# Patient Record
Sex: Male | Born: 1960 | Race: White | Hispanic: No | Marital: Married | Smoking: Current every day smoker
Health system: Southern US, Community
[De-identification: ages and names within clinical notes are randomized; demographics above are authoritative.]

## PROBLEM LIST (undated history)

## (undated) DIAGNOSIS — F1020 Alcohol dependence, uncomplicated: Secondary | ICD-10-CM

## (undated) DIAGNOSIS — Z789 Other specified health status: Secondary | ICD-10-CM

## (undated) HISTORY — PX: HAND SURGERY: SHX662

## (undated) HISTORY — PX: FINGER SURGERY: SHX640

---

## 2000-01-14 ENCOUNTER — Encounter: Admission: RE | Admit: 2000-01-14 | Discharge: 2000-01-14 | Payer: Self-pay | Admitting: Occupational Medicine

## 2000-01-14 ENCOUNTER — Encounter: Payer: Self-pay | Admitting: Occupational Medicine

## 2014-08-28 ENCOUNTER — Emergency Department (HOSPITAL_COMMUNITY): Payer: No Typology Code available for payment source

## 2014-08-28 ENCOUNTER — Observation Stay (HOSPITAL_COMMUNITY)
Admission: EM | Admit: 2014-08-28 | Discharge: 2014-08-30 | Disposition: A | Discharge status: Home / Self Care | Payer: No Typology Code available for payment source | Attending: General Surgery | Admitting: General Surgery

## 2014-08-28 ENCOUNTER — Encounter (HOSPITAL_COMMUNITY): Payer: Self-pay

## 2014-08-28 DIAGNOSIS — S2243XA Multiple fractures of ribs, bilateral, initial encounter for closed fracture: Secondary | ICD-10-CM | POA: Diagnosis not present

## 2014-08-28 DIAGNOSIS — W2212XA Striking against or struck by front passenger side automobile airbag, initial encounter: Secondary | ICD-10-CM | POA: Insufficient documentation

## 2014-08-28 DIAGNOSIS — S2222XA Fracture of body of sternum, initial encounter for closed fracture: Secondary | ICD-10-CM | POA: Diagnosis not present

## 2014-08-28 DIAGNOSIS — Z23 Encounter for immunization: Secondary | ICD-10-CM | POA: Diagnosis not present

## 2014-08-28 DIAGNOSIS — S161XXA Strain of muscle, fascia and tendon at neck level, initial encounter: Secondary | ICD-10-CM | POA: Diagnosis not present

## 2014-08-28 DIAGNOSIS — M542 Cervicalgia: Secondary | ICD-10-CM | POA: Diagnosis present

## 2014-08-28 DIAGNOSIS — S022XXA Fracture of nasal bones, initial encounter for closed fracture: Secondary | ICD-10-CM | POA: Insufficient documentation

## 2014-08-28 DIAGNOSIS — S2220XA Unspecified fracture of sternum, initial encounter for closed fracture: Secondary | ICD-10-CM | POA: Diagnosis present

## 2014-08-28 DIAGNOSIS — F17210 Nicotine dependence, cigarettes, uncomplicated: Secondary | ICD-10-CM | POA: Insufficient documentation

## 2014-08-28 HISTORY — DX: Other specified health status: Z78.9

## 2014-08-28 LAB — CBC WITH DIFFERENTIAL/PLATELET
BASOS ABS: 0.1 10*3/uL (ref 0.0–0.1)
BASOS PCT: 1 % (ref 0–1)
EOS PCT: 3 % (ref 0–5)
Eosinophils Absolute: 0.2 10*3/uL (ref 0.0–0.7)
HCT: 39.2 % (ref 39.0–52.0)
Hemoglobin: 13.5 g/dL (ref 13.0–17.0)
Lymphocytes Relative: 26 % (ref 12–46)
Lymphs Abs: 2.3 10*3/uL (ref 0.7–4.0)
MCH: 33.4 pg (ref 26.0–34.0)
MCHC: 34.4 g/dL (ref 30.0–36.0)
MCV: 97 fL (ref 78.0–100.0)
MONO ABS: 0.9 10*3/uL (ref 0.1–1.0)
Monocytes Relative: 11 % (ref 3–12)
Neutro Abs: 5.2 10*3/uL (ref 1.7–7.7)
Neutrophils Relative %: 59 % (ref 43–77)
PLATELETS: 316 10*3/uL (ref 150–400)
RBC: 4.04 MIL/uL — AB (ref 4.22–5.81)
RDW: 13.1 % (ref 11.5–15.5)
WBC: 8.7 10*3/uL (ref 4.0–10.5)

## 2014-08-28 LAB — COMPREHENSIVE METABOLIC PANEL
ALBUMIN: 4.2 g/dL (ref 3.5–5.0)
ALT: 67 U/L — AB (ref 17–63)
AST: 69 U/L — AB (ref 15–41)
Alkaline Phosphatase: 54 U/L (ref 38–126)
Anion gap: 6 (ref 5–15)
BUN: 21 mg/dL — AB (ref 6–20)
CHLORIDE: 112 mmol/L — AB (ref 101–111)
CO2: 23 mmol/L (ref 22–32)
Calcium: 9.4 mg/dL (ref 8.9–10.3)
Creatinine, Ser: 1.38 mg/dL — ABNORMAL HIGH (ref 0.61–1.24)
GFR calc Af Amer: >60 mL/min (ref >60)
GFR calc non Af Amer: 57 mL/min — ABNORMAL LOW (ref >60)
GLUCOSE: 90 mg/dL (ref 65–99)
POTASSIUM: 3.7 mmol/L (ref 3.5–5.1)
SODIUM: 141 mmol/L (ref 135–145)
Total Bilirubin: 0.5 mg/dL (ref 0.3–1.2)
Total Protein: 7.2 g/dL (ref 6.5–8.1)

## 2014-08-28 MED ORDER — HYDROMORPHONE HCL 1 MG/ML IJ SOLN
1.0000 mg | Freq: Once | INTRAMUSCULAR | Status: AC
  Administered 2014-08-28: 1 mg via INTRAVENOUS
  Filled 2014-08-28: qty 1

## 2014-08-28 MED ORDER — SODIUM CHLORIDE 0.9 % IV BOLUS (SEPSIS)
1000.0000 mL | Freq: Once | INTRAVENOUS | Status: AC
  Administered 2014-08-28: 1000 mL via INTRAVENOUS

## 2014-08-28 MED ORDER — SODIUM CHLORIDE 0.9 % IV BOLUS (SEPSIS)
500.0000 mL | Freq: Once | INTRAVENOUS | Status: AC
  Administered 2014-08-28: 500 mL via INTRAVENOUS

## 2014-08-28 MED ORDER — IOHEXOL 300 MG/ML  SOLN
100.0000 mL | Freq: Once | INTRAMUSCULAR | Status: AC | PRN
  Administered 2014-08-28: 100 mL via INTRAVENOUS

## 2014-08-28 MED ORDER — ONDANSETRON HCL 4 MG/2ML IJ SOLN
4.0000 mg | Freq: Once | INTRAMUSCULAR | Status: AC
  Administered 2014-08-28: 4 mg via INTRAVENOUS
  Filled 2014-08-28: qty 2

## 2014-08-28 MED ORDER — HYDROMORPHONE HCL 1 MG/ML IJ SOLN
0.5000 mg | Freq: Once | INTRAMUSCULAR | Status: AC
  Administered 2014-08-28: 0.5 mg via INTRAVENOUS
  Filled 2014-08-28: qty 1

## 2014-08-28 MED ORDER — HYDROMORPHONE HCL 1 MG/ML IJ SOLN
INTRAMUSCULAR | Status: AC
  Filled 2014-08-28: qty 1

## 2014-08-28 MED ORDER — HYDROMORPHONE HCL 1 MG/ML IJ SOLN
1.0000 mg | Freq: Once | INTRAMUSCULAR | Status: AC
  Administered 2014-08-28: 1 mg via INTRAVENOUS
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
## 2014-08-28 NOTE — ED Notes (Addendum)
C spine was previously cleared but pt complaining of neck pain C-collar still in place. Pt also reports sternal and rib pain.
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
## 2014-08-29 ENCOUNTER — Encounter (HOSPITAL_COMMUNITY): Payer: Self-pay | Admitting: General Practice

## 2014-08-29 ENCOUNTER — Observation Stay (HOSPITAL_COMMUNITY): Payer: No Typology Code available for payment source

## 2014-08-29 DIAGNOSIS — S2243XA Multiple fractures of ribs, bilateral, initial encounter for closed fracture: Secondary | ICD-10-CM | POA: Diagnosis present

## 2014-08-29 DIAGNOSIS — S2220XA Unspecified fracture of sternum, initial encounter for closed fracture: Secondary | ICD-10-CM | POA: Diagnosis present

## 2014-08-29 DIAGNOSIS — S161XXA Strain of muscle, fascia and tendon at neck level, initial encounter: Secondary | ICD-10-CM | POA: Diagnosis present

## 2014-08-29 DIAGNOSIS — S022XXA Fracture of nasal bones, initial encounter for closed fracture: Secondary | ICD-10-CM | POA: Diagnosis present

## 2014-08-29 MED ORDER — DOCUSATE SODIUM 100 MG PO CAPS
100.0000 mg | ORAL_CAPSULE | Freq: Two times a day (BID) | ORAL | Status: DC
  Administered 2014-08-29 – 2014-08-30 (×3): 100 mg via ORAL
  Filled 2014-08-29 (×3): qty 1

## 2014-08-29 MED ORDER — SODIUM CHLORIDE 0.9 % IJ SOLN: 3.0000 mL | INTRAMUSCULAR | Status: DC | PRN

## 2014-08-29 MED ORDER — HYDROMORPHONE HCL 1 MG/ML IJ SOLN
0.5000 mg | INTRAMUSCULAR | Status: DC | PRN
  Administered 2014-08-29 – 2014-08-30 (×3): 0.5 mg via INTRAVENOUS
  Filled 2014-08-29 (×3): qty 1

## 2014-08-29 MED ORDER — HYDROMORPHONE HCL 1 MG/ML IJ SOLN
1.0000 mg | INTRAMUSCULAR | Status: DC | PRN
  Administered 2014-08-29 (×2): 1 mg via INTRAVENOUS
  Filled 2014-08-29 (×2): qty 1

## 2014-08-29 MED ORDER — HYDROMORPHONE HCL 1 MG/ML IJ SOLN
1.0000 mg | Freq: Once | INTRAMUSCULAR | Status: AC
Start: 2014-08-29 — End: 2014-08-29
  Administered 2014-08-29: 1 mg via INTRAVENOUS
  Filled 2014-08-29: qty 1

## 2014-08-29 MED ORDER — POLYETHYLENE GLYCOL 3350 17 G PO PACK
17.0000 g | PACK | Freq: Every day | ORAL | Status: DC
  Administered 2014-08-29 – 2014-08-30 (×2): 17 g via ORAL
  Filled 2014-08-29 (×2): qty 1

## 2014-08-29 MED ORDER — ONDANSETRON HCL 4 MG PO TABS: 4.0000 mg | ORAL_TABLET | Freq: Four times a day (QID) | ORAL | Status: DC | PRN

## 2014-08-29 MED ORDER — SODIUM CHLORIDE 0.9 % IJ SOLN: 3.0000 mL | Freq: Two times a day (BID) | INTRAMUSCULAR | Status: DC

## 2014-08-29 MED ORDER — PNEUMOCOCCAL VAC POLYVALENT 25 MCG/0.5ML IJ INJ
0.5000 mL | INJECTION | INTRAMUSCULAR | Status: AC
  Administered 2014-08-30: 0.5 mL via INTRAMUSCULAR
  Filled 2014-08-29: qty 0.5

## 2014-08-29 MED ORDER — SODIUM CHLORIDE 0.9 % IV SOLN: 250.0000 mL | INTRAVENOUS | Status: DC | PRN

## 2014-08-29 MED ORDER — ONDANSETRON HCL 4 MG/2ML IJ SOLN
4.0000 mg | Freq: Four times a day (QID) | INTRAMUSCULAR | Status: DC | PRN
Start: 2014-08-29 — End: 2014-08-30

## 2014-08-29 MED ORDER — ENOXAPARIN SODIUM 40 MG/0.4ML ~~LOC~~ SOLN
40.0000 mg | SUBCUTANEOUS | Status: DC
  Administered 2014-08-29 – 2014-08-30 (×2): 40 mg via SUBCUTANEOUS
  Filled 2014-08-29 (×2): qty 0.4

## 2014-08-29 MED ORDER — OXYCODONE HCL 5 MG PO TABS
5.0000 mg | ORAL_TABLET | ORAL | Status: DC | PRN
Start: 2014-08-29 — End: 2014-08-30
  Administered 2014-08-29: 15 mg via ORAL
  Administered 2014-08-29 (×2): 10 mg via ORAL
  Administered 2014-08-30 (×3): 15 mg via ORAL
  Filled 2014-08-29 (×3): qty 3
  Filled 2014-08-29 (×2): qty 2
  Filled 2014-08-29: qty 3

## 2014-08-29 MED ORDER — OXYCODONE HCL 5 MG PO TABS
5.0000 mg | ORAL_TABLET | ORAL | Status: DC | PRN
  Administered 2014-08-29 (×2): 5 mg via ORAL
  Filled 2014-08-29 (×2): qty 1

## 2014-08-29 NOTE — ED Notes (Signed)
Transported in stable condition , respirations unlabored , IV site intact , Dilaudid 1 mg given for bilateral ribcage and sternal pain prior to transport.
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
## 2014-08-30 MED ORDER — OXYCODONE HCL 10 MG PO TABS: 10.0000 mg | ORAL_TABLET | ORAL | Status: DC | PRN

## 2014-08-30 MED ORDER — OXYCODONE-ACETAMINOPHEN 10-325 MG PO TABS: 1.0000 | ORAL_TABLET | ORAL | Status: DC | PRN

## 2014-08-30 MED ORDER — NAPROXEN 500 MG PO TABS: 500.0000 mg | ORAL_TABLET | Freq: Two times a day (BID) | ORAL | Status: DC

## 2014-08-30 MED ORDER — NAPROXEN 250 MG PO TABS: 500.0000 mg | ORAL_TABLET | Freq: Two times a day (BID) | ORAL | Status: DC

## 2014-08-30 MED ORDER — OXYCODONE HCL 5 MG PO TABS
10.0000 mg | ORAL_TABLET | ORAL | Status: DC | PRN
  Administered 2014-08-30: 20 mg via ORAL
  Filled 2014-08-30: qty 4

## 2014-08-30 NOTE — Discharge Instructions (Signed)
No driving while taking oxycodone. °
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
## 2014-08-30 NOTE — Progress Notes (Signed)
Andre Rangel discharged home per MD order. Discharge instructions reviewed and discussed with patient. All questions and concerns answered. Copy of instructions and scripts given to patient. IV removed.  Patient escorted to car by staff in a wheelchair. No distress noted upon discharge.   [name] 08/30/2014 3:24 PM
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
## 2014-09-04 ENCOUNTER — Telehealth: Payer: Self-pay | Admitting: *Deleted

## 2014-09-04 ENCOUNTER — Telehealth (HOSPITAL_COMMUNITY): Payer: Self-pay

## 2014-09-04 NOTE — Telephone Encounter (Signed)
Pt called for phone number to reach Earney [place], [place]. NCM gave him [phone]

## 2014-09-05 NOTE — Telephone Encounter (Signed)
NA @ home number. Left message at cell but said owner was "Matt".

## 2014-09-06 MED ORDER — TRAMADOL HCL 50 MG PO TABS: 100.0000 mg | ORAL_TABLET | Freq: Four times a day (QID) | ORAL | Status: AC

## 2014-09-06 NOTE — Telephone Encounter (Signed)
Pt called to say pain meds were not strong enough. Suggested he try tramadol in addition and I wrote a rx for him to pick up.

## 2014-09-13 ENCOUNTER — Other Ambulatory Visit (HOSPITAL_COMMUNITY): Payer: Self-pay

## 2014-09-13 MED ORDER — OXYCODONE-ACETAMINOPHEN 10-325 MG PO TABS
1.0000 | ORAL_TABLET | ORAL | Status: DC | PRN
Start: 2014-09-13 — End: 2017-05-28

## 2014-09-13 NOTE — Telephone Encounter (Signed)
DTR called for pain med refill. Gave rx for Perc 10/325, #20

## 2015-04-20 ENCOUNTER — Encounter (HOSPITAL_COMMUNITY): Payer: Self-pay | Admitting: Emergency Medicine

## 2015-04-20 ENCOUNTER — Emergency Department (HOSPITAL_COMMUNITY)
Admission: EM | Admit: 2015-04-20 | Discharge: 2015-04-20 | Disposition: A | Discharge status: Home / Self Care | Payer: Self-pay | Attending: Emergency Medicine | Admitting: Emergency Medicine

## 2015-04-20 ENCOUNTER — Emergency Department (HOSPITAL_COMMUNITY): Payer: Self-pay

## 2015-04-20 DIAGNOSIS — S0990XA Unspecified injury of head, initial encounter: Secondary | ICD-10-CM

## 2015-04-20 DIAGNOSIS — F17210 Nicotine dependence, cigarettes, uncomplicated: Secondary | ICD-10-CM | POA: Insufficient documentation

## 2015-04-20 DIAGNOSIS — S0181XA Laceration without foreign body of other part of head, initial encounter: Secondary | ICD-10-CM

## 2015-04-20 DIAGNOSIS — Y9389 Activity, other specified: Secondary | ICD-10-CM | POA: Insufficient documentation

## 2015-04-20 DIAGNOSIS — Y929 Unspecified place or not applicable: Secondary | ICD-10-CM | POA: Insufficient documentation

## 2015-04-20 DIAGNOSIS — S0101XA Laceration without foreign body of scalp, initial encounter: Secondary | ICD-10-CM | POA: Insufficient documentation

## 2015-04-20 DIAGNOSIS — Y999 Unspecified external cause status: Secondary | ICD-10-CM | POA: Insufficient documentation

## 2015-04-20 MED ORDER — ACETAMINOPHEN 500 MG PO TABS
1000.0000 mg | ORAL_TABLET | Freq: Once | ORAL | Status: AC
  Administered 2015-04-20: 1000 mg via ORAL
  Filled 2015-04-20: qty 2
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
## 2015-04-20 NOTE — ED Notes (Signed)
Cleaned two laceration to head with wound cleanser and applied steri-strips, patient tolerated well.

## 2015-04-20 NOTE — Discharge Instructions (Signed)
Brain scan showed no acute injury. Simple Steri-Strips to laceration.  Keep area clean and dry.
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
## 2017-05-27 ENCOUNTER — Encounter (HOSPITAL_COMMUNITY): Payer: Self-pay | Admitting: Emergency Medicine

## 2017-05-27 ENCOUNTER — Inpatient Hospital Stay (HOSPITAL_COMMUNITY)
Admission: EM | Admit: 2017-05-27 | Discharge: 2017-06-04 | DRG: 897 | Disposition: A | Discharge status: Home / Self Care | Payer: Self-pay | Attending: Internal Medicine | Admitting: Internal Medicine

## 2017-05-27 ENCOUNTER — Other Ambulatory Visit: Payer: Self-pay

## 2017-05-27 DIAGNOSIS — F17210 Nicotine dependence, cigarettes, uncomplicated: Secondary | ICD-10-CM | POA: Diagnosis present

## 2017-05-27 DIAGNOSIS — R7303 Prediabetes: Secondary | ICD-10-CM | POA: Diagnosis present

## 2017-05-27 DIAGNOSIS — M479 Spondylosis, unspecified: Secondary | ICD-10-CM | POA: Diagnosis present

## 2017-05-27 DIAGNOSIS — M160 Bilateral primary osteoarthritis of hip: Secondary | ICD-10-CM | POA: Diagnosis present

## 2017-05-27 DIAGNOSIS — E861 Hypovolemia: Secondary | ICD-10-CM | POA: Diagnosis present

## 2017-05-27 DIAGNOSIS — Z6823 Body mass index (BMI) 23.0-23.9, adult: Secondary | ICD-10-CM

## 2017-05-27 DIAGNOSIS — F10939 Alcohol use, unspecified with withdrawal, unspecified: Secondary | ICD-10-CM

## 2017-05-27 DIAGNOSIS — E871 Hypo-osmolality and hyponatremia: Secondary | ICD-10-CM | POA: Diagnosis present

## 2017-05-27 DIAGNOSIS — F10239 Alcohol dependence with withdrawal, unspecified: Principal | ICD-10-CM | POA: Diagnosis present

## 2017-05-27 DIAGNOSIS — R739 Hyperglycemia, unspecified: Secondary | ICD-10-CM

## 2017-05-27 DIAGNOSIS — Z885 Allergy status to narcotic agent status: Secondary | ICD-10-CM

## 2017-05-27 DIAGNOSIS — E440 Moderate protein-calorie malnutrition: Secondary | ICD-10-CM | POA: Diagnosis present

## 2017-05-27 DIAGNOSIS — Z888 Allergy status to other drugs, medicaments and biological substances status: Secondary | ICD-10-CM

## 2017-05-27 DIAGNOSIS — K7010 Alcoholic hepatitis without ascites: Secondary | ICD-10-CM | POA: Diagnosis present

## 2017-05-27 HISTORY — DX: Alcohol dependence, uncomplicated: F10.20

## 2017-05-27 LAB — COMPREHENSIVE METABOLIC PANEL
ALBUMIN: 4.4 g/dL (ref 3.5–5.0)
ALK PHOS: 85 U/L (ref 38–126)
ALT: 189 U/L — AB (ref 17–63)
AST: 266 U/L — ABNORMAL HIGH (ref 15–41)
BUN: 21 mg/dL — AB (ref 6–20)
CHLORIDE: 81 mmol/L — AB (ref 101–111)
CO2: 19 mmol/L — AB (ref 22–32)
CREATININE: 0.84 mg/dL (ref 0.61–1.24)
Calcium: 9.4 mg/dL (ref 8.9–10.3)
GFR calc Af Amer: >60 mL/min (ref >60)
GFR calc non Af Amer: >60 mL/min (ref >60)
Glucose, Bld: 130 mg/dL — ABNORMAL HIGH (ref 65–99)
Potassium: 4.3 mmol/L (ref 3.5–5.1)
SODIUM: 125 mmol/L — AB (ref 135–145)
Total Bilirubin: 2.1 mg/dL — ABNORMAL HIGH (ref 0.3–1.2)
Total Protein: 8.6 g/dL — ABNORMAL HIGH (ref 6.5–8.1)

## 2017-05-27 LAB — MAGNESIUM: Magnesium: 1.7 mg/dL (ref 1.7–2.4)

## 2017-05-27 LAB — RAPID URINE DRUG SCREEN, HOSP PERFORMED
Amphetamines: NOT DETECTED
BARBITURATES: NOT DETECTED
Benzodiazepines: NOT DETECTED
Cocaine: NOT DETECTED
Opiates: NOT DETECTED
Tetrahydrocannabinol: NOT DETECTED

## 2017-05-27 LAB — CBC
HCT: 39.9 % (ref 39.0–52.0)
Hemoglobin: 14.5 g/dL (ref 13.0–17.0)
MCH: 31.9 pg (ref 26.0–34.0)
MCHC: 36.3 g/dL — ABNORMAL HIGH (ref 30.0–36.0)
MCV: 87.7 fL (ref 78.0–100.0)
Platelets: 128 K/uL — ABNORMAL LOW (ref 150–400)
RBC: 4.55 MIL/uL (ref 4.22–5.81)
RDW: 12.1 % (ref 11.5–15.5)
WBC: 8.1 K/uL (ref 4.0–10.5)

## 2017-05-27 LAB — ETHANOL: Alcohol, Ethyl (B): <10 mg/dL

## 2017-05-27 LAB — PROTIME-INR
INR: 1.01
Prothrombin Time: 13.2 seconds (ref 11.4–15.2)

## 2017-05-27 MED ORDER — LORAZEPAM 1 MG PO TABS: 0.0000 mg | ORAL_TABLET | Freq: Two times a day (BID) | ORAL | Status: DC

## 2017-05-27 MED ORDER — LORAZEPAM 1 MG PO TABS
0.0000 mg | ORAL_TABLET | Freq: Four times a day (QID) | ORAL | Status: DC
  Administered 2017-05-28 (×3): 1 mg via ORAL
  Administered 2017-05-28 – 2017-05-29 (×4): 2 mg via ORAL
  Filled 2017-05-27: qty 2
  Filled 2017-05-27: qty 1
  Filled 2017-05-27 (×5): qty 2

## 2017-05-27 MED ORDER — IBUPROFEN 400 MG PO TABS
400.0000 mg | ORAL_TABLET | Freq: Once | ORAL | Status: AC
  Administered 2017-05-27: 400 mg via ORAL
  Filled 2017-05-27: qty 1

## 2017-05-27 MED ORDER — LORAZEPAM 1 MG PO TABS
1.0000 mg | ORAL_TABLET | Freq: Four times a day (QID) | ORAL | Status: AC | PRN
  Administered 2017-05-29 – 2017-05-30 (×2): 1 mg via ORAL
  Filled 2017-05-27 (×2): qty 1

## 2017-05-27 MED ORDER — ADULT MULTIVITAMIN W/MINERALS CH: 1.0000 | ORAL_TABLET | Freq: Every day | ORAL | Status: DC

## 2017-05-27 MED ORDER — ENOXAPARIN SODIUM 40 MG/0.4ML ~~LOC~~ SOLN
40.0000 mg | SUBCUTANEOUS | Status: DC
  Administered 2017-05-28 – 2017-06-03 (×8): 40 mg via SUBCUTANEOUS
  Filled 2017-05-27 (×8): qty 0.4

## 2017-05-27 MED ORDER — SODIUM CHLORIDE 0.9 % IV SOLN
INTRAVENOUS | Status: AC
  Administered 2017-05-28 (×2): via INTRAVENOUS

## 2017-05-27 MED ORDER — OXYCODONE HCL 5 MG PO TABS
5.0000 mg | ORAL_TABLET | Freq: Four times a day (QID) | ORAL | Status: DC | PRN
  Administered 2017-05-28: 5 mg via ORAL
  Administered 2017-05-28 – 2017-05-29 (×5): 10 mg via ORAL
  Filled 2017-05-27: qty 1
  Filled 2017-05-27 (×5): qty 2

## 2017-05-27 MED ORDER — LACTATED RINGERS IV BOLUS
1000.0000 mL | Freq: Once | INTRAVENOUS | Status: AC
  Administered 2017-05-27: 1000 mL via INTRAVENOUS

## 2017-05-27 MED ORDER — LORAZEPAM 2 MG/ML IJ SOLN
1.0000 mg | Freq: Once | INTRAMUSCULAR | Status: AC
  Administered 2017-05-27: 1 mg via INTRAVENOUS
  Filled 2017-05-27: qty 1

## 2017-05-27 MED ORDER — FAMOTIDINE IN NACL 20-0.9 MG/50ML-% IV SOLN
20.0000 mg | Freq: Two times a day (BID) | INTRAVENOUS | Status: DC
  Administered 2017-05-28: 20 mg via INTRAVENOUS
  Filled 2017-05-27 (×2): qty 50

## 2017-05-27 MED ORDER — ADULT MULTIVITAMIN W/MINERALS CH
1.0000 | ORAL_TABLET | Freq: Every day | ORAL | Status: DC
  Administered 2017-05-27 – 2017-06-04 (×9): 1 via ORAL
  Filled 2017-05-27 (×9): qty 1

## 2017-05-27 MED ORDER — IBUPROFEN 400 MG PO TABS
400.0000 mg | ORAL_TABLET | Freq: Four times a day (QID) | ORAL | Status: DC | PRN
  Administered 2017-05-28 – 2017-06-03 (×7): 400 mg via ORAL
  Filled 2017-05-27 (×7): qty 1

## 2017-05-27 MED ORDER — FOLIC ACID 1 MG PO TABS
1.0000 mg | ORAL_TABLET | Freq: Every day | ORAL | Status: DC
  Administered 2017-05-27 – 2017-06-04 (×9): 1 mg via ORAL
  Filled 2017-05-27 (×9): qty 1

## 2017-05-27 MED ORDER — FOLIC ACID 1 MG PO TABS: 1.0000 mg | ORAL_TABLET | Freq: Every day | ORAL | Status: DC

## 2017-05-27 MED ORDER — LORAZEPAM 2 MG/ML IJ SOLN
1.0000 mg | Freq: Four times a day (QID) | INTRAMUSCULAR | Status: DC | PRN
  Administered 2017-05-27: 1 mg via INTRAVENOUS
  Filled 2017-05-27: qty 1

## 2017-05-27 MED ORDER — ONDANSETRON HCL 4 MG PO TABS
4.0000 mg | ORAL_TABLET | Freq: Four times a day (QID) | ORAL | Status: DC | PRN
  Administered 2017-06-02 – 2017-06-03 (×3): 4 mg via ORAL
  Filled 2017-05-27 (×3): qty 1

## 2017-05-27 MED ORDER — MAGNESIUM SULFATE 2 GM/50ML IV SOLN
2.0000 g | Freq: Once | INTRAVENOUS | Status: AC
  Administered 2017-05-28: 2 g via INTRAVENOUS
  Filled 2017-05-27: qty 50

## 2017-05-27 MED ORDER — VITAMIN B-1 100 MG PO TABS
100.0000 mg | ORAL_TABLET | Freq: Every day | ORAL | Status: DC
  Administered 2017-05-27 – 2017-06-04 (×9): 100 mg via ORAL
  Filled 2017-05-27 (×9): qty 1

## 2017-05-27 MED ORDER — THIAMINE HCL 100 MG/ML IJ SOLN
100.0000 mg | Freq: Every day | INTRAMUSCULAR | Status: DC
  Filled 2017-05-27: qty 2

## 2017-05-27 MED ORDER — ONDANSETRON HCL 4 MG/2ML IJ SOLN
4.0000 mg | Freq: Four times a day (QID) | INTRAMUSCULAR | Status: DC | PRN
  Administered 2017-05-29 – 2017-05-31 (×2): 4 mg via INTRAVENOUS
  Filled 2017-05-27 (×2): qty 2

## 2017-05-27 MED ORDER — THIAMINE HCL 100 MG/ML IJ SOLN
Freq: Once | INTRAVENOUS | Status: AC
  Administered 2017-05-27: 17:00:00 via INTRAVENOUS
  Filled 2017-05-27: qty 1000

## 2017-05-27 MED ORDER — LORAZEPAM 1 MG PO TABS: 1.0000 mg | ORAL_TABLET | Freq: Four times a day (QID) | ORAL | Status: DC | PRN

## 2017-05-27 MED ORDER — VITAMIN B-1 100 MG PO TABS: 100.0000 mg | ORAL_TABLET | Freq: Every day | ORAL | Status: DC

## 2017-05-27 MED ORDER — ONDANSETRON HCL 4 MG/2ML IJ SOLN
4.0000 mg | Freq: Once | INTRAMUSCULAR | Status: AC
  Administered 2017-05-27: 4 mg via INTRAVENOUS
  Filled 2017-05-27: qty 2

## 2017-05-27 MED ORDER — LORAZEPAM 2 MG/ML IJ SOLN
1.0000 mg | Freq: Four times a day (QID) | INTRAMUSCULAR | Status: AC | PRN
  Administered 2017-05-30 (×3): 1 mg via INTRAVENOUS
  Filled 2017-05-27 (×3): qty 1

## 2017-05-27 MED ORDER — THIAMINE HCL 100 MG/ML IJ SOLN: 100.0000 mg | Freq: Every day | INTRAMUSCULAR | Status: DC

## 2017-05-27 NOTE — ED Triage Notes (Signed)
Pt states he thinks he is going through ETOH withdrawal, last drank 1L wine this morning. States he normally drinks 3-4L of wine a day. N/ and leg weakness per pt. CBG 113. Minimal hand tremor noted.

## 2017-05-27 NOTE — ED Notes (Signed)
Patient still unable to void at this time.  
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
## 2017-05-28 DIAGNOSIS — Z720 Tobacco use: Secondary | ICD-10-CM

## 2017-05-28 DIAGNOSIS — R739 Hyperglycemia, unspecified: Secondary | ICD-10-CM

## 2017-05-28 LAB — CBC WITH DIFFERENTIAL/PLATELET
Basophils Absolute: 0 10*3/uL (ref 0.0–0.1)
Basophils Relative: 0 %
Eosinophils Absolute: 0 10*3/uL (ref 0.0–0.7)
Eosinophils Relative: 0 %
HCT: 32.3 % — ABNORMAL LOW (ref 39.0–52.0)
HEMOGLOBIN: 11.5 g/dL — AB (ref 13.0–17.0)
LYMPHS ABS: 1.6 10*3/uL (ref 0.7–4.0)
Lymphocytes Relative: 17 %
MCH: 31.6 pg (ref 26.0–34.0)
MCHC: 35.6 g/dL (ref 30.0–36.0)
MCV: 88.7 fL (ref 78.0–100.0)
MONOS PCT: 8 %
Monocytes Absolute: 0.7 10*3/uL (ref 0.1–1.0)
NEUTROS PCT: 75 %
Neutro Abs: 7 10*3/uL (ref 1.7–7.7)
Platelets: 97 10*3/uL — ABNORMAL LOW (ref 150–400)
RBC: 3.64 MIL/uL — ABNORMAL LOW (ref 4.22–5.81)
RDW: 12.2 % (ref 11.5–15.5)
WBC: 9.3 10*3/uL (ref 4.0–10.5)

## 2017-05-28 LAB — COMPREHENSIVE METABOLIC PANEL
ALBUMIN: 3.4 g/dL — AB (ref 3.5–5.0)
ALT: 132 U/L — ABNORMAL HIGH (ref 17–63)
AST: 199 U/L — AB (ref 15–41)
Alkaline Phosphatase: 67 U/L (ref 38–126)
Anion gap: 10 (ref 5–15)
BUN: 22 mg/dL — AB (ref 6–20)
CHLORIDE: 92 mmol/L — AB (ref 101–111)
CO2: 25 mmol/L (ref 22–32)
Calcium: 8.3 mg/dL — ABNORMAL LOW (ref 8.9–10.3)
Creatinine, Ser: 0.88 mg/dL (ref 0.61–1.24)
GFR calc non Af Amer: >60 mL/min (ref >60)
Glucose, Bld: 120 mg/dL — ABNORMAL HIGH (ref 65–99)
Potassium: 3.2 mmol/L — ABNORMAL LOW (ref 3.5–5.1)
SODIUM: 127 mmol/L — AB (ref 135–145)
Total Bilirubin: 1.4 mg/dL — ABNORMAL HIGH (ref 0.3–1.2)
Total Protein: 6.4 g/dL — ABNORMAL LOW (ref 6.5–8.1)

## 2017-05-28 LAB — HEMOGLOBIN A1C
Hgb A1c MFr Bld: 5.8 % — ABNORMAL HIGH (ref 4.8–5.6)
MEAN PLASMA GLUCOSE: 119.76 mg/dL

## 2017-05-28 LAB — PHOSPHORUS: Phosphorus: 2.7 mg/dL (ref 2.5–4.6)

## 2017-05-28 LAB — GLUCOSE, CAPILLARY: Glucose-Capillary: 93 mg/dL (ref 65–99)

## 2017-05-28 LAB — MAGNESIUM: MAGNESIUM: 2.3 mg/dL (ref 1.7–2.4)

## 2017-05-28 MED ORDER — POTASSIUM CHLORIDE CRYS ER 20 MEQ PO TBCR
40.0000 meq | EXTENDED_RELEASE_TABLET | ORAL | Status: AC
  Administered 2017-05-28 (×3): 40 meq via ORAL
  Filled 2017-05-28 (×4): qty 2

## 2017-05-28 MED ORDER — NICOTINE 21 MG/24HR TD PT24
21.0000 mg | MEDICATED_PATCH | Freq: Every day | TRANSDERMAL | Status: DC
  Administered 2017-05-28 – 2017-06-04 (×8): 21 mg via TRANSDERMAL
  Filled 2017-05-28 (×8): qty 1

## 2017-05-28 MED ORDER — ENSURE ENLIVE PO LIQD
237.0000 mL | Freq: Two times a day (BID) | ORAL | Status: DC
  Administered 2017-05-28 – 2017-06-03 (×13): 237 mL via ORAL

## 2017-05-28 MED ORDER — FAMOTIDINE 20 MG PO TABS
20.0000 mg | ORAL_TABLET | Freq: Two times a day (BID) | ORAL | Status: DC
  Administered 2017-05-28 – 2017-06-04 (×15): 20 mg via ORAL
  Filled 2017-05-28 (×15): qty 1
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
## 2017-05-28 NOTE — Clinical Social Work Note (Signed)
Clinical Social Work Assessment  Patient Details  Name: Andre Rangel MRN: [id number] Date of Birth: August 18, 1960  Date of referral:  05/28/17               Reason for consult:  Substance Use/ETOH Abuse                Permission sought to share information with:    Permission granted to share information::     Name::        Agency::     Relationship::     Contact Information:     Housing/Transportation Living arrangements for the past 2 months:  Single Family Home Source of Information:  Patient Patient Interpreter Needed:  None Criminal Activity/Legal Involvement Pertinent to Current Situation/Hospitalization:  No - Comment as needed Significant Relationships:  Spouse Lives with:  Spouse Do you feel safe going back to the place where you live?  Yes Need for family participation in patient care:  No (Coment)  Care giving concerns: No care giving concerns identified.   Social Worker assessment / plan: Received consult to see pt for substance abuse treatment resources. Pt is a 57 year old male admitted from home. Met with pt today to assess. Pt was independent in ADLs prior to admission. He states that plan is for return to home at dc. Pt does not currently have health insurance. Pt has a long history of regular alcohol use. He states that he wants to stop drinking. Provided pt with verbal and written information on available options. Pt is only interested in outpatient services. Support and encouragement provided.   Employment status:  Unemployed Forensic scientist:  Self Pay (Medicaid Pending) PT Recommendations:  Not assessed at this time Information / Referral to community resources:  Outpatient Substance Abuse Treatment Options  Patient/Family's Response to care: Pt accepting of care.  Patient/Family's Understanding of and Emotional Response to Diagnosis, Current Treatment, and Prognosis: Pt appears to have a good understanding of diagnosis and treatment recommendations. Pt  appears uncomfortable physically. Emotional support provided related to pt's concerns about his alcohol use.  Emotional Assessment Appearance:  Appears stated age Attitude/Demeanor/Rapport:    Affect (typically observed):  Quiet Orientation:  Oriented to Self, Oriented to Place, Oriented to  Time, Oriented to Situation Alcohol / Substance use:  Tobacco Use Psych involvement (Current and /or in the community):  No (Comment)  Discharge Needs  Concerns to be addressed:  Substance Abuse Concerns Readmission within the last 30 days:  No Current discharge risk:  Substance Abuse Barriers to Discharge:  Active Substance Use   Shade Flood, LCSW 05/28/2017, 11:40 AM

## 2017-05-28 NOTE — Progress Notes (Addendum)
[facility] PROGRESS NOTE  Gleen Ripberger ZOX:[id number] DOB: October 19, 1960 DOA: 05/27/2017 PCP: Patient, No Pcp Per  Interim summary and HPI  57 y.o. male with medical history significant for alcohol dependence, now presenting to the emergency department with abdominal tenderness, nausea, and nonbloody vomiting, requesting help in achieving abstinence from alcohol.  Patient reports many years of alcohol dependence, typically consuming 3 to 4 L of wine daily.  He reports recently drinking more than his usual amount for 1 to 2 weeks before deciding to quit today.  He drank 1 L of wine last night.  Reports generalized abdominal tenderness with nausea and nonbloody vomiting, but denies fevers, chills, chest pain, or shortness of breath.  He reports quitting alcohol briefly in the past, suffered some mild withdrawal symptoms then, but denies experiencing hallucinations or seizures with abstinence.  He reports being determined to quit drinking for good, but believes he will need help achieving this.  He denies use of illicit substances and denies SI, HI, or hallucinations.  Assessment/Plan: 1-alcohol dependency with acute withdrawal: -Patient has determined that he will quit drinking -Will continue IV fluids, thiamine, folic acid, Ativan and follow CIWA protocol -in the next 48 hours transition to oral librium and discharge with tapering instructions  -continue electrolytes repletion -follow B12 level  2-alcoholic hepatitis -continue supportive care -LFT's trending down -no need for steroids  3-hyponatremia: -in the setting of chronic alcohol use -continue IVF's -follow electrolytes trend  -asymptomatic currently  4-hyperglycemia -without prior hx of DM -will check a1C  5-tobacco abuse -will use nicotine patch  -I have discussed tobacco cessation with the patient.  I have counseled the patient regarding the negative impacts of continued tobacco use including but not limited to lung  cancer, COPD, and cardiovascular disease.  I have discussed alternatives to tobacco and modalities that may help facilitate tobacco cessation including but not limited to biofeedback, hypnosis, and medications.  Total time spent with tobacco counseling was 5 minutes.  Code Status: Full code Family Communication: No family at bedside. Disposition Plan: Continue IV fluids (suggesting rate), continue supportive care and continue the use of CIWA protocol. Will ask SW to provide info for outpatient resources.    Consultants:  None  Procedures:  See below for x-ray reports.  Antibiotics:  None  HPI/Subjective: Slightly anxious, feeling tremulous.  Denies chest pain, no shortness of breath.  Objective: Vitals:   05/28/17 0802 05/28/17 1234  BP: 123/85 117/76  Pulse: 95 81  Resp: 14 14  Temp: 98.1 F (36.7 C) 98.1 F (36.7 C)  SpO2: 98% 99%    Intake/Output Summary (Last 24 hours) at 05/28/2017 1616 Last data filed at 05/28/2017 0500 Gross per 24 hour  Intake 1887.92 ml  Output -  Net 1887.92 ml   Filed Weights   05/27/17 1403 05/28/17 0000  Weight: 77.1 kg (170 lb) 73.9 kg (162 lb 14.7 oz)    Exam:   General: Afebrile, no chest pain, no shortness of breath.  Patient feeling tremulous and is slightly anxious.  Cardiovascular: Mild tachycardia, no rubs, no gallops, no murmurs.  Respiratory: Good air movement bilaterally, no wheezing, no crackles.  Abdomen: Soft, nontender, nondistended, positive bowel sounds.  Musculoskeletal: Reporting some pain in his hips bilaterally, no joint swelling, no cyanosis, no clubbing.  Data Reviewed: Basic Metabolic Panel: Recent Labs  Lab 05/27/17 1410 05/27/17 1659 05/28/17 0635  NA 125*  --  127*  K 4.3  --  3.2*  CL 81*  --  92*  CO2 19*  --  25  GLUCOSE 130*  --  120*  BUN 21*  --  22*  CREATININE 0.84  --  0.88  CALCIUM 9.4  --  8.3*  MG  --  1.7 2.3  PHOS  --   --  2.7   Liver Function Tests: Recent Labs  Lab  05/27/17 1410 05/28/17 0635  AST 266* 199*  ALT 189* 132*  ALKPHOS 85 67  BILITOT 2.1* 1.4*  PROT 8.6* 6.4*  ALBUMIN 4.4 3.4*   CBC: Recent Labs  Lab 05/27/17 1410 05/28/17 0635  WBC 8.1 9.3  NEUTROABS  --  7.0  HGB 14.5 11.5*  HCT 39.9 32.3*  MCV 87.7 88.7  PLT 128* 97*   CBG: Recent Labs  Lab 05/28/17 0804  GLUCAP 93    Studies: No results found.  Scheduled Meds: . enoxaparin (LOVENOX) injection  40 mg Subcutaneous Q24H  . famotidine  20 mg Oral BID  . feeding supplement (ENSURE ENLIVE)  237 mL Oral BID BM  . folic acid  1 mg Oral Daily  . LORazepam  0-4 mg Oral Q6H   Followed by  . [START ON 05/29/2017] LORazepam  0-4 mg Oral Q12H  . multivitamin with minerals  1 tablet Oral Daily  . nicotine  21 mg Transdermal Daily  . potassium chloride  40 mEq Oral Q4H  . thiamine  100 mg Oral Daily   Or  . thiamine  100 mg Intravenous Daily     Time spent: 30 minutes    Vassie Loll  [facility] Pager [phone]. If 7PM-7AM, please contact night-coverage at [email or web address], password [facility] 05/28/2017, 4:16 PM  LOS: 1 day

## 2017-05-28 NOTE — Plan of Care (Signed)
progressing 

## 2017-05-28 NOTE — Plan of Care (Signed)
Oxycodone fro pain control

## 2017-05-29 DIAGNOSIS — E440 Moderate protein-calorie malnutrition: Secondary | ICD-10-CM

## 2017-05-29 LAB — GLUCOSE, CAPILLARY
GLUCOSE-CAPILLARY: 106 mg/dL — AB (ref 65–99)
Glucose-Capillary: 168 mg/dL — ABNORMAL HIGH (ref 65–99)

## 2017-05-29 LAB — HEPATITIS PANEL, ACUTE
HCV Ab: >11 s/co ratio — ABNORMAL HIGH (ref 0.0–0.9)
HEP B C IGM: NEGATIVE
Hep A IgM: NEGATIVE
Hepatitis B Surface Ag: NEGATIVE

## 2017-05-29 LAB — BASIC METABOLIC PANEL
ANION GAP: 9 (ref 5–15)
BUN: 15 mg/dL (ref 6–20)
CHLORIDE: 97 mmol/L — AB (ref 101–111)
CO2: 25 mmol/L (ref 22–32)
Calcium: 8.8 mg/dL — ABNORMAL LOW (ref 8.9–10.3)
Creatinine, Ser: 0.77 mg/dL (ref 0.61–1.24)
Glucose, Bld: 111 mg/dL — ABNORMAL HIGH (ref 65–99)
POTASSIUM: 3.7 mmol/L (ref 3.5–5.1)
SODIUM: 131 mmol/L — AB (ref 135–145)

## 2017-05-29 LAB — HIV ANTIBODY (ROUTINE TESTING W REFLEX): HIV Screen 4th Generation wRfx: NONREACTIVE

## 2017-05-29 LAB — VITAMIN B12: Vitamin B-12: 1837 pg/mL — ABNORMAL HIGH (ref 180–914)

## 2017-05-29 MED ORDER — CHLORDIAZEPOXIDE HCL 5 MG PO CAPS
15.0000 mg | ORAL_CAPSULE | Freq: Three times a day (TID) | ORAL | Status: AC
  Administered 2017-05-29 – 2017-05-30 (×5): 15 mg via ORAL
  Filled 2017-05-29 (×5): qty 3

## 2017-05-29 MED ORDER — SODIUM CHLORIDE 0.9 % IV SOLN
INTRAVENOUS | Status: AC
  Administered 2017-05-29 – 2017-05-30 (×2): via INTRAVENOUS

## 2017-05-29 MED ORDER — TRAMADOL HCL 50 MG PO TABS
50.0000 mg | ORAL_TABLET | Freq: Three times a day (TID) | ORAL | Status: DC | PRN
  Administered 2017-05-29 – 2017-06-04 (×8): 50 mg via ORAL
  Filled 2017-05-29 (×9): qty 1

## 2017-05-29 NOTE — Progress Notes (Signed)
[facility] PROGRESS NOTE  Andre Rangel WUJ:[id number] DOB: Feb 15, 1960 DOA: 05/27/2017 PCP: Patient, No Pcp Per  Interim summary and HPI  57 y.o. male with medical history significant for alcohol dependence, now presenting to the emergency department with abdominal tenderness, nausea, and nonbloody vomiting, requesting help in achieving abstinence from alcohol.  Patient reports many years of alcohol dependence, typically consuming 3 to 4 L of wine daily.  He reports recently drinking more than his usual amount for 1 to 2 weeks before deciding to quit today.  He drank 1 L of wine last night.  Reports generalized abdominal tenderness with nausea and nonbloody vomiting, but denies fevers, chills, chest pain, or shortness of breath.  He reports quitting alcohol briefly in the past, suffered some mild withdrawal symptoms then, but denies experiencing hallucinations or seizures with abstinence.  He reports being determined to quit drinking for good, but believes he will need help achieving this.  He denies use of illicit substances and denies SI, HI, or hallucinations.  Assessment/Plan: 1-alcohol dependency with acute withdrawal: -Patient has determined that he will quit drinking and is asking for assistance with it.  -New IV fluids, supportive care, thiamine, folic acid and as needed lorazepam following CIWA protocol. -start librium TID -Follow electrolytes and further replete as needed. -follow B12 level; still pending  2-alcoholic hepatitis -continue supportive care -LFT's trending down; continue to monitor trend intermittently. -no need for steroids  3-hyponatremia: -in the setting of chronic alcohol use -continue IVF's, sodium improving appropriately and currently 131. -Follow electrolytes trend intermittently.  4-hyperglycemia -A1c 5.8 -Patient reaching the category of prediabetes -Will be encouraged to watch the amount of carbohydrates in his diet.  5-tobacco abuse -will  continue to use nicotine patch  -Long discussion on cessation provided on 05/28/2017. -Once again today encouraged to keep in cell smoking free.  6-hip pain and pain at the base of his neck -Most likely secondary to osteoarthritis -Will use tramadol as needed for pain control.  Code Status: Full code Family Communication: No family at bedside. Disposition Plan: Continue IV fluids.  Low-dose tramadol for pain.  Start Librium.continue CIWA protocol PRN only.    Consultants:  None  Procedures:  See below for x-ray reports.  Antibiotics:  None  HPI/Subjective: Still anxious and feeling tremulous,CIWA score 12.  No fever, no chest pain, no nausea or vomiting.  Complaining of pain in his hips and at the base of his neck.  Objective: Vitals:   05/29/17 0539 05/29/17 1200  BP: (!) 141/94 (!) 146/87  Pulse: 96 (!) 102  Resp: 17   Temp: 98 F (36.7 C)   SpO2: 99%     Intake/Output Summary (Last 24 hours) at 05/29/2017 1308 Last data filed at 05/29/2017 0641 Gross per 24 hour  Intake 360 ml  Output 900 ml  Net -540 ml   Filed Weights   05/27/17 1403 05/28/17 0000  Weight: 77.1 kg (170 lb) 73.9 kg (162 lb 14.7 oz)    Exam:   General: No fever, no chest pain, no shortness of breath.  Feeling slightly better, still anxious and tremulous.  Complaining of pain on the base of his neck and also bilateral hips.   Cardiovascular: Mild tachycardia, no rubs, no gallops, no murmurs.   Respiratory: Good air movement bilaterally, no wheezing, no crackles.    Abdomen: Soft, nontender, nondistended, positive bowel sounds.  Musculoskeletal: Pain in bilateral hips, no joint swelling, no cyanosis, no clubbing.  Data Reviewed: Basic Metabolic Panel: Recent Labs  Lab 05/27/17 1410 05/27/17 1659 05/28/17 0635 05/29/17 0650  NA 125*  --  127* 131*  K 4.3  --  3.2* 3.7  CL 81*  --  92* 97*  CO2 19*  --  25 25  GLUCOSE 130*  --  120* 111*  BUN 21*  --  22* 15  CREATININE 0.84   --  0.88 0.77  CALCIUM 9.4  --  8.3* 8.8*  MG  --  1.7 2.3  --   PHOS  --   --  2.7  --    Liver Function Tests: Recent Labs  Lab 05/27/17 1410 05/28/17 0635  AST 266* 199*  ALT 189* 132*  ALKPHOS 85 67  BILITOT 2.1* 1.4*  PROT 8.6* 6.4*  ALBUMIN 4.4 3.4*   CBC: Recent Labs  Lab 05/27/17 1410 05/28/17 0635  WBC 8.1 9.3  NEUTROABS  --  7.0  HGB 14.5 11.5*  HCT 39.9 32.3*  MCV 87.7 88.7  PLT 128* 97*   CBG: Recent Labs  Lab 05/28/17 0804 05/29/17 0830  GLUCAP 93 106*    Studies: No results found.  Scheduled Meds: . enoxaparin (LOVENOX) injection  40 mg Subcutaneous Q24H  . famotidine  20 mg Oral BID  . feeding supplement (ENSURE ENLIVE)  237 mL Oral BID BM  . folic acid  1 mg Oral Daily  . LORazepam  0-4 mg Oral Q6H   Followed by  . LORazepam  0-4 mg Oral Q12H  . multivitamin with minerals  1 tablet Oral Daily  . nicotine  21 mg Transdermal Daily  . thiamine  100 mg Oral Daily   Or  . thiamine  100 mg Intravenous Daily     Time spent: 30 minutes    Vassie Loll  [facility] Pager [phone]. If 7PM-7AM, please contact night-coverage at [email or web address], password [facility] 05/29/2017, 1:08 PM  LOS: 2 days

## 2017-05-29 NOTE — Plan of Care (Signed)
progressing 

## 2017-05-30 LAB — COMPREHENSIVE METABOLIC PANEL
ALBUMIN: 3.3 g/dL — AB (ref 3.5–5.0)
ALT: 210 U/L — ABNORMAL HIGH (ref 17–63)
ANION GAP: 10 (ref 5–15)
AST: 295 U/L — ABNORMAL HIGH (ref 15–41)
Alkaline Phosphatase: 88 U/L (ref 38–126)
BILIRUBIN TOTAL: 0.9 mg/dL (ref 0.3–1.2)
BUN: 15 mg/dL (ref 6–20)
CALCIUM: 9.2 mg/dL (ref 8.9–10.3)
CO2: 26 mmol/L (ref 22–32)
Chloride: 97 mmol/L — ABNORMAL LOW (ref 101–111)
Creatinine, Ser: 0.75 mg/dL (ref 0.61–1.24)
GLUCOSE: 107 mg/dL — AB (ref 65–99)
Potassium: 4 mmol/L (ref 3.5–5.1)
Sodium: 133 mmol/L — ABNORMAL LOW (ref 135–145)
TOTAL PROTEIN: 6.5 g/dL (ref 6.5–8.1)

## 2017-05-30 LAB — GLUCOSE, CAPILLARY: GLUCOSE-CAPILLARY: 110 mg/dL — AB (ref 65–99)

## 2017-05-30 MED ORDER — SODIUM CHLORIDE 0.9 % IV SOLN
INTRAVENOUS | Status: AC
  Administered 2017-05-30: 15:00:00 via INTRAVENOUS

## 2017-05-30 MED ORDER — CHLORDIAZEPOXIDE HCL 5 MG PO CAPS
10.0000 mg | ORAL_CAPSULE | Freq: Three times a day (TID) | ORAL | Status: DC
  Administered 2017-05-31 – 2017-06-04 (×13): 10 mg via ORAL
  Filled 2017-05-30 (×13): qty 2

## 2017-05-30 NOTE — Progress Notes (Signed)
[facility] PROGRESS NOTE  Andre Rangel ZOX:[id number] DOB: 12/16/1960 DOA: 05/27/2017 PCP: Patient, No Pcp Per  Interim summary and HPI  57 y.o. male with medical history significant for alcohol dependence, now presenting to the emergency department with abdominal tenderness, nausea, and nonbloody vomiting, requesting help in achieving abstinence from alcohol.  Patient reports many years of alcohol dependence, typically consuming 3 to 4 L of wine daily.  He reports recently drinking more than his usual amount for 1 to 2 weeks before deciding to quit today.  He drank 1 L of wine last night.  Reports generalized abdominal tenderness with nausea and nonbloody vomiting, but denies fevers, chills, chest pain, or shortness of breath.  He reports quitting alcohol briefly in the past, suffered some mild withdrawal symptoms then, but denies experiencing hallucinations or seizures with abstinence.  He reports being determined to quit drinking for good, but believes he will need help achieving this.  He denies use of illicit substances and denies SI, HI, or hallucinations.  Assessment/Plan: 1-alcohol dependency with acute withdrawal: -Patient has determined that he will quit drinking and is asking for assistance with it.  -continue IV fluids, supportive care, thiamine, folic acid and as needed lorazepam following CIWA protocol. -start librium TID, with intention for tapering every 2 days. -Follow electrolytes and further replete as needed. -B12 level stable and no need an supplementation.  2-alcoholic hepatitis -continue supportive care -continue to monitor trend intermittently. -no need for steroids at this time  3-hyponatremia: -in the setting of chronic alcohol use -continue gentle IVF's resuscitation, sodium improving appropriately and currently 133 now. -Follow electrolytes trend intermittently.  4-hyperglycemia -A1c 5.8 -Patient reaching the category of prediabetes -Patient encouraged  to follow low carbohydrate diet.    5-tobacco abuse -Continue nicotine patch.   -Long discussion on cessation provided on 05/28/2017. -Patient contemplating quitting.  6-hip pain and pain at the base of his neck -Most likely secondary to osteoarthritis -Reports improvement in his discomfort with the use of tramadol -Continue PRN analgesics.  7-physical deconditioning: -Seen by physical therapy -At this moment recommending skilled nursing facility for physical rehabilitation and strengthening. -Social worker consulted.  Code Status: Full code Family Communication: No family at bedside. Disposition Plan: Continue gentle IV fluids.  Continue as needed low-dose tramadol for pain.  Continue Librium And CIWA protocol PRN only.    Consultants:  None  Procedures:  See below for x-ray reports.  Antibiotics:  None  HPI/Subjective: Still tremulous, less anxious, feeling better overall.  CIWA score 8-11.  There is no chest pain, no shortness of breath, no nausea vomiting.  Patient off balance and feeling weak.  Objective: Vitals:   05/29/17 2336 05/30/17 0524  BP: (!) 145/84 (!) 154/97  Pulse: 98 (!) 103  Resp: 17 19  Temp: 98.2 F (36.8 C) 98.1 F (36.7 C)  SpO2: 100% 98%    Intake/Output Summary (Last 24 hours) at 05/30/2017 1228 Last data filed at 05/30/2017 0517 Gross per 24 hour  Intake 1666.25 ml  Output 350 ml  Net 1316.25 ml   Filed Weights   05/27/17 1403 05/28/17 0000  Weight: 77.1 kg (170 lb) 73.9 kg (162 lb 14.7 oz)    Exam:   General: No fever, no chest pain, no shortness of breath.  Patient feeling better overall, but having difficulties with his balance and is physically deconditioned. CIWA 8-11 now; less anxious, still tremulous.   Cardiovascular: S1 and S2, no rubs, no gallops, no JVD.  No murmurs on exam.  Respiratory: Good air movement bilaterally, no wheezing, no crackles..    Abdomen: Soft, nontender, nondistended, positive bowel sounds, no  guarding.  Musculoskeletal: Reports improvement in his bilateral hip pain, there is no joint swelling, no erythematous changes, no cyanosis or clubbing.  Data Reviewed: Basic Metabolic Panel: Recent Labs  Lab 05/27/17 1410 05/27/17 1659 05/28/17 0635 05/29/17 0650 05/30/17 0629  NA 125*  --  127* 131* 133*  K 4.3  --  3.2* 3.7 4.0  CL 81*  --  92* 97* 97*  CO2 19*  --  GLUCOSE 130*  --  120* 111* 107*  BUN 21*  --  22* 15 15  CREATININE 0.84  --  0.88 0.77 0.75  CALCIUM 9.4  --  8.3* 8.8* 9.2  MG  --  1.7 2.3  --   --   PHOS  --   --  2.7  --   --    Liver Function Tests: Recent Labs  Lab 05/27/17 1410 05/28/17 0635 05/30/17 0629  AST 266* 199* 295*  ALT 189* 132* 210*  ALKPHOS 85 67 88  BILITOT 2.1* 1.4* 0.9  PROT 8.6* 6.4* 6.5  ALBUMIN 4.4 3.4* 3.3*   CBC: Recent Labs  Lab 05/27/17 1410 05/28/17 0635  WBC 8.1 9.3  NEUTROABS  --  7.0  HGB 14.5 11.5*  HCT 39.9 32.3*  MCV 87.7 88.7  PLT 128* 97*   CBG: Recent Labs  Lab 05/28/17 0804 05/29/17 0830 05/29/17 2108 05/30/17 0822  GLUCAP 93 106* 168* 110*    Studies: No results found.  Scheduled Meds: . chlordiazePOXIDE  15 mg Oral TID  . enoxaparin (LOVENOX) injection  40 mg Subcutaneous Q24H  . famotidine  20 mg Oral BID  . feeding supplement (ENSURE ENLIVE)  237 mL Oral BID BM  . folic acid  1 mg Oral Daily  . multivitamin with minerals  1 tablet Oral Daily  . nicotine  21 mg Transdermal Daily  . thiamine  100 mg Oral Daily   Or  . thiamine  100 mg Intravenous Daily     Time spent: 30 minutes    Vassie Loll  [facility] Pager [phone]. If 7PM-7AM, please contact night-coverage at [email or web address], password [facility] 05/30/2017, 12:28 PM  LOS: 3 days

## 2017-05-30 NOTE — Evaluation (Signed)
Physical Therapy Evaluation Patient Details Name: Andre Rangel MRN: [id number] DOB: 04-Dec-1960 Today's Date: 05/30/2017   History of Present Illness  Andre Rangel is a 57 y.o. male with medical history significant for alcohol dependence, now presenting to the emergency department with abdominal tenderness, nausea, and nonbloody vomiting, requesting help in achieving abstinence from alcohol.  Patient reports many years of alcohol dependence, typically consuming 3 to 4 L of wine daily.  He reports recently drinking more than his usual amount for 1 to 2 weeks before deciding to quit today.  He drank 1 L of wine last night.  Reports generalized abdominal tenderness with nausea and nonbloody vomiting, but denies fevers, chills, chest pain, or shortness of breath.  He reports quitting alcohol briefly in the past, suffered some mild withdrawal symptoms then, but denies experiencing hallucinations or seizures with abstinence.  He reports being determined to quit drinking for good, but believes he will need help achieving this.  He denies use of illicit substances and denies SI, HI, or hallucinations.    Clinical Impression  Patient functioning well below baseline, requiring much assist for functional mobility and gait due to BLE weakness, frequent buckling of knees during sit to stands, transfers and unable to take steps beyond bedside due to fall risk.  Patient will benefit from continued physical therapy in hospital and recommended venue below to increase strength, balance, endurance for safe ADLs and gait.    Follow Up Recommendations SNF;Supervision/Assistance - 24 hour    Equipment Recommendations  Rolling walker with 5" wheels    Recommendations for Other Services       Precautions / Restrictions Precautions Precautions: Fall Restrictions Weight Bearing Restrictions: No      Mobility  Bed Mobility Overal bed mobility: Needs Assistance Bed Mobility: Supine to Sit;Sit to Supine      Supine to sit: Min assist Sit to supine: Min assist   General bed mobility comments: slow labored movement  Transfers Overall transfer level: Needs assistance Equipment used: Rolling walker (2 wheeled);None Transfers: Sit to/from [employer] Sit to Stand: Mod assist Stand pivot transfers: Mod assist       General transfer comment: patient very unsteady on feet, unsafe without assistived device (AD)  Ambulation/Gait Ambulation/Gait assistance: Mod assist;Max assist Ambulation Distance (Feet): 5 Feet Assistive device: Rolling walker (2 wheeled) Gait Pattern/deviations: Decreased step length - right;Decreased step length - left;Decreased stride length;Shuffle Gait velocity: slow   General Gait Details: limited to 5-6 steps at bedsides demonstrating narrow base of support, shuffling steps, and poor standing balance  Stairs            Wheelchair Mobility    Modified Rankin (Stroke Patients Only)       Balance Overall balance assessment: Needs assistance Sitting-balance support: Feet supported;No upper extremity supported Sitting balance-Leahy Scale: Good     Standing balance support: During functional activity;No upper extremity supported Standing balance-Leahy Scale: Poor Standing balance comment: poor with or without AD, unable to fully lock knees                             Pertinent Vitals/Pain Pain Assessment: 0-10 Pain Score: 9  Pain Location: Bilateral hips to feet Pain Descriptors / Indicators: Aching;Discomfort Pain Intervention(s): Limited activity within patient's tolerance;Monitored during session;Premedicated before session    Home Living Family/patient expects to be discharged to:: Private residence Living Arrangements: Children(daughter and son-n-law) Available Help at Discharge: Family Type of Home: Mobile home  Home Access: Stairs to enter Entrance Stairs-Rails: Right;Left;Can reach both Entrance Stairs-Number of  Steps: 5-6 Home Layout: One level Home Equipment: None      Prior Function Level of Independence: Independent         Comments: mostly household ambulator     Hand Dominance        Extremity/Trunk Assessment   Upper Extremity Assessment Upper Extremity Assessment: Generalized weakness    Lower Extremity Assessment Lower Extremity Assessment: Generalized weakness    Cervical / Trunk Assessment Cervical / Trunk Assessment: Normal  Communication   Communication: No difficulties  Cognition Arousal/Alertness: Awake/alert Behavior During Therapy: WFL for tasks assessed/performed Overall Cognitive Status: Within Functional Limits for tasks assessed                                        General Comments      Exercises     Assessment/Plan    PT Assessment Patient needs continued PT services  PT Problem List Decreased strength;Decreased activity tolerance;Decreased balance;Decreased mobility       PT Treatment Interventions Gait training;Stair training;Functional mobility training;Therapeutic activities;Therapeutic exercise;Patient/family education    PT Goals (Current goals can be found in the Care Plan section)  Acute Rehab PT Goals Patient Stated Goal: return home when able to walk PT Goal Formulation: With patient Time For Goal Achievement: 06/20/17 Potential to Achieve Goals: Good    Frequency Min 4X/week   Barriers to discharge Decreased caregiver support      Co-evaluation               AM-PAC PT "6 Clicks" Daily Activity  Outcome Measure Difficulty turning over in bed (including adjusting bedclothes, sheets and blankets)?: A Little Difficulty moving from lying on back to sitting on the side of the bed? : A Lot Difficulty sitting down on and standing up from a chair with arms (e.g., wheelchair, bedside commode, etc,.)?: A Lot Help needed moving to and from a bed to chair (including a wheelchair)?: A Lot Help needed walking  in hospital room?: Total Help needed climbing 3-5 steps with a railing? : Total 6 Click Score: 11    End of Session Equipment Utilized During Treatment: Gait belt Activity Tolerance: Patient tolerated treatment well;Patient limited by fatigue;Patient limited by pain Patient left: in chair;with call bell/phone within reach;with chair alarm set Nurse Communication: Mobility status;Other (comment)(RN notified patient left up in chair) PT Visit Diagnosis: Unsteadiness on feet (R26.81);Other abnormalities of gait and mobility (R26.89);Muscle weakness (generalized) (M62.81)    Time: 9604-5409 PT Time Calculation (min) (ACUTE ONLY): 25 min   Charges:   PT Evaluation $PT Eval Moderate Complexity: 1 Mod PT Treatments $Therapeutic Activity: 23-37 mins   PT G Codes:        11:56 AM, 2017-06-02 Ocie Bob, MPT Physical Therapist with [facility] 336 [phone] office [phone] mobile phone

## 2017-05-30 NOTE — Plan of Care (Signed)
  Problem: Acute Rehab PT Goals(only PT should resolve) Goal: Pt Will Go Supine/Side To Sit Outcome: Progressing Flowsheets (Taken 05/30/2017 1157) Pt will go Supine/Side to Sit: with min guard assist Goal: Patient Will Transfer Sit To/From Stand Outcome: Progressing Flowsheets (Taken 05/30/2017 1157) Patient will transfer sit to/from stand: with min guard assist Goal: Pt Will Transfer Bed To Chair/Chair To Bed Outcome: Progressing Flowsheets (Taken 05/30/2017 1157) Pt will Transfer Bed to Chair/Chair to Bed: min guard assist Goal: Pt Will Ambulate Outcome: Progressing Flowsheets (Taken 05/30/2017 1157) Pt will Ambulate: 50 feet;with minimal assist;with rolling walker  11:58 AM, 05/30/17 Ocie Bob, MPT Physical Therapist with [facility] 336 [phone] office [phone] mobile phone

## 2017-05-30 NOTE — Plan of Care (Signed)
progressing 

## 2017-05-31 DIAGNOSIS — F10939 Alcohol use, unspecified with withdrawal, unspecified: Secondary | ICD-10-CM

## 2017-05-31 DIAGNOSIS — F10239 Alcohol dependence with withdrawal, unspecified: Secondary | ICD-10-CM

## 2017-05-31 LAB — GLUCOSE, CAPILLARY: Glucose-Capillary: 96 mg/dL (ref 65–99)

## 2017-05-31 MED ORDER — ALUM & MAG HYDROXIDE-SIMETH 200-200-20 MG/5ML PO SUSP
30.0000 mL | ORAL | Status: DC | PRN
  Administered 2017-05-31: 30 mL via ORAL
  Filled 2017-05-31: qty 30

## 2017-05-31 NOTE — Clinical Social Work Note (Addendum)
LCSW following. Discussed pt in Progression today. PT has evaluated pt and is recommending SNF STR. Pt does not have insurance. Met with pt today to review dc planning.  Per pt, prior to admission, he had been living with his daughter for about 3-5 months. Pt states that he isn't going to be able to return there at dc because they "got in a fuss". Per pt, it was related to his drinking. Pt gave permission for LCSW to contact his daughter but he stated that he thinks she changed her phone number.   Pt states that for the last six months he has been having trouble walking due to pain in his right lower leg. He stated that he could walk but he would have to stop frequently and rest. Pt stated that the 5-8 days prior to admission, he could not walk at all and he was lying in bed all day.   Updated pt that he would not be able to go to SNF rehab without insurance unless he could privately pay for it. Per pt, he does not have any assets or savings. Pt states he has nowhere else to go and he will be homeless at dc.  Concern is that if pt cannot ambulate, he would not be safe to dc to a homeless shelter. Updated CSW Supervisor. Spoke with PT and was informed that pt is requiring moderate assistance with ambulation/transfers today. Detox process continues. MD indicated pt could potentially be stable for dc tomorrow.   Attempted to reach pt's daughter at the number listed in the chart and it does not connect.   Will follow up tomorrow and further assist with dc planning.

## 2017-05-31 NOTE — Progress Notes (Signed)
Physical Therapy Treatment Patient Details Name: Andre Rangel MRN: [id number] DOB: [birth year] Today's Date: 05/31/2017    History of Present Illness Andre Rangel is a 57 y.o. male with medical history significant for alcohol dependence, now presenting to the emergency department with abdominal tenderness, nausea, and nonbloody vomiting, requesting help in achieving abstinence from alcohol.  Patient reports many years of alcohol dependence, typically consuming 3 to 4 L of wine daily.  He reports recently drinking more than his usual amount for 1 to 2 weeks before deciding to quit today.  He drank 1 L of wine last night.  Reports generalized abdominal tenderness with nausea and nonbloody vomiting, but denies fevers, chills, chest pain, or shortness of breath.  He reports quitting alcohol briefly in the past, suffered some mild withdrawal symptoms then, but denies experiencing hallucinations or seizures with abstinence.  He reports being determined to quit drinking for good, but believes he will need help achieving this.  He denies use of illicit substances and denies SI, HI, or hallucinations.    PT Comments    Patient demonstrates slightly increased BLE strength for sit to stands, taking side steps at bedside, continues to c/o pain BLE from hips down, unsafe to ambulate away from bedside due to frequent buckling of knees and tolerated sitting up in chair after therapy.  Patient will benefit from continued physical therapy in hospital and recommended venue below to increase strength, balance, endurance for safe ADLs and gait.    Follow Up Recommendations  SNF;Supervision/Assistance - 24 hour     Equipment Recommendations  Rolling walker with 5" wheels    Recommendations for Other Services       Precautions / Restrictions Precautions Precautions: Fall Restrictions Weight Bearing Restrictions: No    Mobility  Bed Mobility Overal bed mobility: Needs Assistance Bed Mobility: Supine to  Sit     Supine to sit: Min guard     General bed mobility comments: with head bed raised and use of siderail  Transfers Overall transfer level: Needs assistance Equipment used: Rolling walker (2 wheeled) Transfers: Sit to/from [employer] Sit to Stand: Min assist;Mod assist Stand pivot transfers: Min assist;Mod assist       General transfer comment: unsteady on feet, buckling of knees  Ambulation/Gait Ambulation/Gait assistance: Mod assist;Max assist Ambulation Distance (Feet): 10 Feet Assistive device: Rolling walker (2 wheeled) Gait Pattern/deviations: Decreased step length - right;Decreased step length - left;Decreased stride length Gait velocity: slow   General Gait Details: limited to 10-12 very short unsteady side steps at bedside and during transfer to w/c, frequent buckling of knees, limited mostly due to fatigue   Stairs             Wheelchair Mobility    Modified Rankin (Stroke Patients Only)       Balance Overall balance assessment: Needs assistance Sitting-balance support: Feet supported;No upper extremity supported Sitting balance-Leahy Scale: Fair     Standing balance support: Bilateral upper extremity supported;During functional activity Standing balance-Leahy Scale: Poor Standing balance comment: fair/poor with RW                            Cognition Arousal/Alertness: Awake/alert Behavior During Therapy: WFL for tasks assessed/performed Overall Cognitive Status: Within Functional Limits for tasks assessed  Exercises General Exercises - Lower Extremity Ankle Circles/Pumps: Seated;AROM;Strengthening;Both;10 reps Long Arc Quad: Seated;AROM;Strengthening;Both;10 reps Hip Flexion/Marching: Seated;AROM;Strengthening;Both;10 reps    General Comments        Pertinent Vitals/Pain Pain Score: 8  Pain Location: Bilateral hips to feet Pain Descriptors /  Indicators: Aching;Discomfort Pain Intervention(s): Limited activity within patient's tolerance;Monitored during session    Home Living                      Prior Function            PT Goals (current goals can now be found in the care plan section) Acute Rehab PT Goals Patient Stated Goal: return home when able to walk PT Goal Formulation: With patient Time For Goal Achievement: 06/20/17 Potential to Achieve Goals: Good Progress towards PT goals: Progressing toward goals    Frequency    Min 4X/week      PT Plan Current plan remains appropriate    Co-evaluation              AM-PAC PT "6 Clicks" Daily Activity  Outcome Measure  Difficulty turning over in bed (including adjusting bedclothes, sheets and blankets)?: A Little Difficulty moving from lying on back to sitting on the side of the bed? : A Little Difficulty sitting down on and standing up from a chair with arms (e.g., wheelchair, bedside commode, etc,.)?: A Lot Help needed moving to and from a bed to chair (including a wheelchair)?: A Lot Help needed walking in hospital room?: A Lot Help needed climbing 3-5 steps with a railing? : Total 6 Click Score: 13    End of Session Equipment Utilized During Treatment: Gait belt Activity Tolerance: Patient tolerated treatment well;Patient limited by fatigue Patient left: in chair;with call bell/phone within reach;with chair alarm set Nurse Communication: Mobility status PT Visit Diagnosis: Unsteadiness on feet (R26.81);Other abnormalities of gait and mobility (R26.89);Muscle weakness (generalized) (M62.81)     Time: 7829-5621 PT Time Calculation (min) (ACUTE ONLY): 25 min  Charges:  $Therapeutic Exercise: 8-22 mins $Therapeutic Activity: 8-22 mins                    G Codes:       2:38 PM, Jun 06, 2017 Ocie Bob, MPT Physical Therapist with [facility] 336 [phone] office [phone] mobile phone

## 2017-05-31 NOTE — Progress Notes (Signed)
[facility] PROGRESS NOTE  Andre Rangel ZOX:[id number] DOB: [birth year] DOA: 05/27/2017 PCP: Andre Rangel, No Pcp Per  Interim summary and HPI  57 y.o. male with medical history significant for alcohol dependence, now presenting to the emergency department with abdominal tenderness, nausea, and nonbloody vomiting, requesting help in achieving abstinence from alcohol.  Andre Rangel reports many years of alcohol dependence, typically consuming 3 to 4 L of wine daily.  He reports recently drinking more than his usual amount for 1 to 2 weeks before deciding to quit today.  He drank 1 L of wine last night.  Reports generalized abdominal tenderness with nausea and nonbloody vomiting, but denies fevers, chills, chest pain, or shortness of breath.  He reports quitting alcohol briefly in the past, suffered some mild withdrawal symptoms then, but denies experiencing hallucinations or seizures with abstinence.  He reports being determined to quit drinking for good, but believes he will need help achieving this.  He denies use of illicit substances and denies SI, HI, or hallucinations.  Assessment/Plan: 1-alcohol dependency with acute withdrawal: -Andre Rangel has determined that he will quit drinking and is asking for assistance with it.  -continue IV fluids, supportive care, thiamine, folic acid and as needed lorazepam following CIWA protocol. -continue librium TID, with intention for tapering every 2 days. -Follow electrolytes and further replete as needed. -B12 level stable and no need an supplementation.  2-alcoholic hepatitis -continue supportive care -continue to monitor trend intermittently. -no need for steroids at this time  3-hyponatremia: -in the setting of chronic alcohol use -continue gentle IVF's resuscitation, sodium improving appropriately and currently 133 now. -Follow electrolytes trend intermittently.  4-hyperglycemia -A1c 5.8 -Andre Rangel reaching the category of prediabetes -Andre Rangel  encouraged to follow low carbohydrate diet.    5-tobacco abuse -Continue nicotine patch.   -Long discussion on cessation provided on 05/28/2017. -Andre Rangel contemplating quitting.  6-hip pain and pain at the base of his neck -Most likely secondary to osteoarthritis -Reports improvement in his discomfort with the use of tramadol -Continue PRN analgesics.  7-physical deconditioning: -Seen by physical therapy -At this moment recommending skilled nursing facility for physical rehabilitation and strengthening. -Social worker on board.  Code Status: Full code Family Communication: No family at bedside. Disposition Plan: Stop IV fluids and ask Andre Rangel to increase p.o. intake.  Continue as needed low-dose tramadol for pain.  Continue Librium And CIWA protocol PRN only. CSW and CM aware of case, to help with discharge needs.    Consultants:  None  Procedures:  See below for x-ray reports.  Antibiotics:  None  HPI/Subjective: Very little tremulous, significant improvement in anxiety.  Feeling better overall.  Still weak and deconditioned.  Objective: Vitals:   05/31/17 0535 05/31/17 1336  BP: (!) 154/106 (!) 156/93  Pulse: (!) 103 88  Resp: 18 18  Temp: 97.8 F (36.6 C) 97.8 F (36.6 C)  SpO2: 100% 97%    Intake/Output Summary (Last 24 hours) at 05/31/2017 1513 Last data filed at 05/31/2017 1200 Gross per 24 hour  Intake 1750.83 ml  Output 1550 ml  Net 200.83 ml   Filed Weights   05/27/17 1403 05/28/17 0000  Weight: 77.1 kg (170 lb) 73.9 kg (162 lb 14.7 oz)    Exam:   General: No fever, no chest pain, no shortness of breath.  Andre Rangel see was score 8-9; very little tremulous on exam and with significant improvement in his anxiety.  Continues to be weak, deconditioned and after to assess once again by physical therapy department in need of  24 7 supervision and assistance, Andre Rangel with poor balance and requiring moderate assistance.  Cardiovascular: S1 and S2, no rubs,  no gallops, no JVD.  No murmurs on exam.  RRR.  Respiratory: Good air movement bilaterally, no wheezing, no crackles.  Abdomen: Soft, nontender, nondistended, positive bowel sounds, no guarding.  Musculoskeletal: No erythematous changes, no cyanosis, no clubbing.  Data Reviewed: Basic Metabolic Panel: Recent Labs  Lab 05/27/17 1410 05/27/17 1659 05/28/17 0635 05/29/17 0650 05/30/17 0629  NA 125*  --  127* 131* 133*  K 4.3  --  3.2* 3.7 4.0  CL 81*  --  92* 97* 97*  CO2 19*  --  GLUCOSE 130*  --  120* 111* 107*  BUN 21*  --  22* 15 15  CREATININE 0.84  --  0.88 0.77 0.75  CALCIUM 9.4  --  8.3* 8.8* 9.2  MG  --  1.7 2.3  --   --   PHOS  --   --  2.7  --   --    Liver Function Tests: Recent Labs  Lab 05/27/17 1410 05/28/17 0635 05/30/17 0629  AST 266* 199* 295*  ALT 189* 132* 210*  ALKPHOS 85 67 88  BILITOT 2.1* 1.4* 0.9  PROT 8.6* 6.4* 6.5  ALBUMIN 4.4 3.4* 3.3*   CBC: Recent Labs  Lab 05/27/17 1410 05/28/17 0635  WBC 8.1 9.3  NEUTROABS  --  7.0  HGB 14.5 11.5*  HCT 39.9 32.3*  MCV 87.7 88.7  PLT 128* 97*   CBG: Recent Labs  Lab 05/28/17 0804 05/29/17 0830 05/29/17 2108 05/30/17 0822 05/31/17 0714  GLUCAP 93 106* 168* 110* 96    Studies: No results found.  Scheduled Meds: . chlordiazePOXIDE  10 mg Oral TID  . enoxaparin (LOVENOX) injection  40 mg Subcutaneous Q24H  . famotidine  20 mg Oral BID  . feeding supplement (ENSURE ENLIVE)  237 mL Oral BID BM  . folic acid  1 mg Oral Daily  . multivitamin with minerals  1 tablet Oral Daily  . nicotine  21 mg Transdermal Daily  . thiamine  100 mg Oral Daily   Or  . thiamine  100 mg Intravenous Daily     Time spent: 25 minutes   Vassie Loll  [facility] Pager [phone]. If 7PM-7AM, please contact night-coverage at [email or web address], password [facility] 05/31/2017, 3:13 PM  LOS: 4 days

## 2017-06-01 DIAGNOSIS — E440 Moderate protein-calorie malnutrition: Secondary | ICD-10-CM

## 2017-06-01 DIAGNOSIS — R739 Hyperglycemia, unspecified: Secondary | ICD-10-CM

## 2017-06-01 LAB — COMPREHENSIVE METABOLIC PANEL
ALBUMIN: 3.4 g/dL — AB (ref 3.5–5.0)
ALT: 159 U/L — AB (ref 17–63)
AST: 155 U/L — AB (ref 15–41)
Alkaline Phosphatase: 87 U/L (ref 38–126)
Anion gap: 6 (ref 5–15)
BUN: 11 mg/dL (ref 6–20)
CHLORIDE: 98 mmol/L — AB (ref 101–111)
CO2: 29 mmol/L (ref 22–32)
CREATININE: 0.81 mg/dL (ref 0.61–1.24)
Calcium: 9.1 mg/dL (ref 8.9–10.3)
GFR calc Af Amer: >60 mL/min (ref >60)
GFR calc non Af Amer: >60 mL/min (ref >60)
Glucose, Bld: 143 mg/dL — ABNORMAL HIGH (ref 65–99)
Potassium: 4.2 mmol/L (ref 3.5–5.1)
SODIUM: 133 mmol/L — AB (ref 135–145)
TOTAL PROTEIN: 6.8 g/dL (ref 6.5–8.1)
Total Bilirubin: 0.7 mg/dL (ref 0.3–1.2)

## 2017-06-01 LAB — GLUCOSE, CAPILLARY: GLUCOSE-CAPILLARY: 111 mg/dL — AB (ref 65–99)

## 2017-06-01 MED ORDER — ADULT MULTIVITAMIN W/MINERALS CH: 1.0000 | ORAL_TABLET | Freq: Every day | ORAL | 1 refills | Status: AC

## 2017-06-01 MED ORDER — NICOTINE 21 MG/24HR TD PT24: 21.0000 mg | MEDICATED_PATCH | Freq: Every day | TRANSDERMAL | 0 refills | Status: AC

## 2017-06-01 MED ORDER — FAMOTIDINE 20 MG PO TABS: 20.0000 mg | ORAL_TABLET | Freq: Two times a day (BID) | ORAL | 1 refills | Status: AC

## 2017-06-01 MED ORDER — ENSURE ENLIVE PO LIQD: 237.0000 mL | Freq: Two times a day (BID) | ORAL | 3 refills | Status: AC

## 2017-06-01 MED ORDER — CHLORDIAZEPOXIDE HCL 10 MG PO CAPS: ORAL_CAPSULE | ORAL | 0 refills | Status: AC

## 2017-06-01 NOTE — Discharge Summary (Signed)
Physician Discharge Summary  Diquan Kassis WUJ:[id number] DOB: 02-Nov-1960 DOA: 05/27/2017  PCP: Andre Rangel, No Pcp Per  Admit date: 05/27/2017 Discharge date: 06/01/2017  Time spent: 35 minutes  Recommendations for Outpatient Follow-up:  1. Repeat CMET to follow electrolytes, renal function and LFTs 2. Continue assisting Andre Rangel as needed with his alcohol cessation journey 3. Close follow-up to Andre Rangel's CBGs and hemoglobin A1c as an outpatient as he is currently prediabetic.   Discharge Diagnoses:  Principal Problem:   Alcohol dependence with withdrawal (HCC) Active Problems:   Alcoholic hepatitis   Hyponatremia   Malnutrition of moderate degree   Alcohol withdrawal syndrome with complication (HCC)   Hyperglycemia   Discharge Condition: Stable.  No having active withdrawal and with instructions to follow Librium tapering.  Discharge with rolling walker.  Diet recommendation: Regular diet  Filed Weights   05/27/17 1403 05/28/17 0000  Weight: 77.1 kg (170 lb) 73.9 kg (162 lb 14.7 oz)    History of present illness:  57 y.o.malewith medical history significant foralcohol dependence, now presenting to the emergency department with abdominal tenderness, nausea, and nonbloody vomiting, requesting help in achieving abstinence from alcohol. Andre Rangel reports many years of alcohol dependence, typically consuming 3 to 4 L of wine daily. He reports recently drinking more than his usual amount for 1 to 2 weeks before deciding to quit today. He drank 1 L of wine last night. Reports generalized abdominal tenderness with nausea and nonbloody vomiting, but denies fevers, chills, chest pain, or shortness of breath. He reports quitting alcohol briefly in the past, suffered some mild withdrawal symptoms then, but denies experiencing hallucinations or seizures with abstinence. He reports being determined to quit drinking for good, but believes he will need help achieving this. He denies use of  illicit substances and denies SI, HI, or hallucinations.  Hospital Course:  1-alcohol dependency with acute withdrawal: -Andre Rangel has determined that he will quit drinking and is asking for assistance with it.  -Rehydrated at the moment of discharge.  Continue multivitamins daily to provide thiamine and folic acid supplementation. -Andre Rangel discharged on Librium tapering -Encouraged to quit drinking  -CIWA 3-4 at discharge -repeat BMET to follow electrolytes and further replete as needed. -B12 level stable and no needing supplementation.  2-alcoholic hepatitis -continue supportive care -continue to monitor trend intermittently. -no need for steroids at this time -LFT's trending down at discharge  3-hyponatremia: -in the setting of chronic alcohol use -sodium improved appropriately and currently 133. -Follow electrolytes trend intermittently. -advise to keep himself well hydrated and to maintain good nutrition   4-hyperglycemia -A1c 5.8 -Andre Rangel reaching the category of prediabetes -Andre Rangel encouraged to follow low carbohydrate diet.    5-tobacco abuse -Continue nicotine patch at discharge.   -Long discussion on cessation provided on 05/28/2017. -Andre Rangel contemplating quitting.  6-hip pain and pain at the base of his neck -Most likely secondary to osteoarthritis -Reports improvement in his discomfort with the use of tramadol. -Continue PRN tylenol at discharge for analgesics purposes.  7-physical deconditioning: -Seen by physical therapy -At this moment recommending skilled nursing facility for physical rehabilitation and strengthening. -Social worker on board; unable to place him -will be discharge with RW -once capable of providing address, [facility] services would be added.   Procedures:  See Below for x-ray reports.  Consultations:  None   Discharge Exam: Vitals:   06/01/17 0302 06/01/17 1319  BP: (!) 137/96 127/84  Pulse: (!) 102 84  Resp: 18 18  Temp: 98.1  F (36.7 C) 98.1 F (36.7  C)  SpO2: 97% 98%     General: No fever, no chest pain, no shortness of breath.  Andre Rangel see was score 8-9; very little tremulous on exam and with significant improvement in his anxiety.  Continues to be weak, deconditioned and after to assess once again by physical therapy department in need of 24 7 supervision and assistance, Andre Rangel with poor balance and requiring moderate assistance.  Cardiovascular: S1 and S2, no rubs, no gallops, no JVD.  No murmurs on exam.  RRR.  Respiratory: Good air movement bilaterally, no wheezing, no crackles.  Abdomen: Soft, nontender, nondistended, positive bowel sounds, no guarding.  Musculoskeletal: No erythematous changes, no cyanosis, no clubbing.   Discharge Instructions   Discharge Instructions    Discharge instructions   Complete by:  As directed    Take medications as prescribed  Keep yourself well hydrated Stop drinking Stop smoking     Allergies as of 06/01/2017      Reactions   Codeine Itching, Nausea And Vomiting   Hydrocodone Itching, Nausea And Vomiting   Other Other (See Comments)   DIMATAPP-childhood reaction      Medication List    TAKE these medications   chlordiazePOXIDE 10 MG capsule Commonly known as:  LIBRIUM Take 1 tablet three times a day for 1 day; then 1 tablet by mouth twice daily x 2 days; then 1 tablet by mouth daily X 3 days and stop librium.   famotidine 20 MG tablet Commonly known as:  PEPCID Take 1 tablet (20 mg total) by mouth 2 (two) times daily.   feeding supplement (ENSURE ENLIVE) Liqd Take 237 mLs by mouth 2 (two) times daily between meals.   multivitamin with minerals Tabs tablet Take 1 tablet by mouth daily. Start taking on:  06/02/2017   nicotine 21 mg/24hr patch Commonly known as:  NICODERM CQ - dosed in mg/24 hours Place 1 patch (21 mg total) onto the skin daily. Start taking on:  06/02/2017            Durable Medical Equipment  (From admission, onward)         Start     Ordered   05/31/17 1303  For home use only DME Walker rolling  Once    Question:  Andre Rangel needs a walker to treat with the following condition  Answer:  General weakness   05/31/17 1302     Allergies  Allergen Reactions  . Codeine Itching and Nausea And Vomiting  . Hydrocodone Itching and Nausea And Vomiting  . Other Other (See Comments)    DIMATAPP-childhood reaction    The results of significant diagnostics from this hospitalization (including imaging, microbiology, ancillary and laboratory) are listed below for reference.    Labs: Basic Metabolic Panel: Recent Labs  Lab 05/27/17 1410 05/27/17 1659 05/28/17 0635 05/29/17 0650 05/30/17 0629 06/01/17 0449  NA 125*  --  127* 131* 133* 133*  K 4.3  --  3.2* 3.7 4.0 4.2  CL 81*  --  92* 97* 97* 98*  CO2 19*  --  GLUCOSE 130*  --  120* 111* 107* 143*  BUN 21*  --  22* CREATININE 0.84  --  0.88 0.77 0.75 0.81  CALCIUM 9.4  --  8.3* 8.8* 9.2 9.1  MG  --  1.7 2.3  --   --   --   PHOS  --   --  2.7  --   --   --  Liver Function Tests: Recent Labs  Lab 05/27/17 1410 05/28/17 0635 05/30/17 0629 06/01/17 0449  AST 266* 199* 295* 155*  ALT 189* 132* 210* 159*  ALKPHOS 85 67 88 87  BILITOT 2.1* 1.4* 0.9 0.7  PROT 8.6* 6.4* 6.5 6.8  ALBUMIN 4.4 3.4* 3.3* 3.4*   CBC: Recent Labs  Lab 05/27/17 1410 05/28/17 0635  WBC 8.1 9.3  NEUTROABS  --  7.0  HGB 14.5 11.5*  HCT 39.9 32.3*  MCV 87.7 88.7  PLT 128* 97*   CBG: Recent Labs  Lab 05/29/17 0830 05/29/17 2108 05/30/17 0822 05/31/17 0714 06/01/17 0715  GLUCAP 106* 168* 110* 96 111*    Signed:  Vassie Loll MD.  [facility] 06/01/2017, 4:19 PM

## 2017-06-01 NOTE — Progress Notes (Signed)
Patient has no safe discharge options at this time.  Discussed with MD and CM.  Patient initially wanted to try to find alternate options for discharge placement. However he was unable to find a safe discharge destination.  Pt is agreeable to inpt. Detox at this time.  CM will follow up with patient in am regarding placement.

## 2017-06-01 NOTE — Progress Notes (Signed)
Physical Therapy Treatment Patient Details Name: Andre Rangel MRN: [id number] DOB: 21-Oct-1960 Today's Date: 06/01/2017    History of Present Illness Andre Rangel is a 57 y.o. male with medical history significant for alcohol dependence, now presenting to the emergency department with abdominal tenderness, nausea, and nonbloody vomiting, requesting help in achieving abstinence from alcohol.  Patient reports many years of alcohol dependence, typically consuming 3 to 4 L of wine daily.  He reports recently drinking more than his usual amount for 1 to 2 weeks before deciding to quit today.  He drank 1 L of wine last night.  Reports generalized abdominal tenderness with nausea and nonbloody vomiting, but denies fevers, chills, chest pain, or shortness of breath.  He reports quitting alcohol briefly in the past, suffered some mild withdrawal symptoms then, but denies experiencing hallucinations or seizures with abstinence.  He reports being determined to quit drinking for good, but believes he will need help achieving this.  He denies use of illicit substances and denies SI, HI, or hallucinations.    PT Comments    Patient modified independent with bed mobility; transfers requiring verbal cues for sequencing of steps and placement of hands and min assist to min guard; ambulated 40 feet with RW and min guard; fatigues quickly; buckling of knees noted with fatigue. Standing balance activities and sit-to-stand x5 reps. Pt admitted with above diagnosis. Pt currently with functional limitations due to the deficits listed below (see PT Problem List) Pt will benefit from skilled PT to increase their independence and safety with mobility to allow discharge to the venue listed below.      Follow Up Recommendations  SNF;Supervision for mobility/OOB     Equipment Recommendations  Rolling walker with 5" wheels    Recommendations for Other Services       Precautions / Restrictions Precautions Precautions:  Fall Restrictions Weight Bearing Restrictions: No    Mobility  Bed Mobility Overal bed mobility: Modified Independent Bed Mobility: Supine to Sit     Supine to sit: Min guard;Modified independent (Device/Increase time) Sit to supine: Modified independent (Device/Increase time)      Transfers Overall transfer level: Needs assistance Equipment used: Rolling walker (2 wheeled) Transfers: Sit to/from [employer] Sit to Stand: Min guard;Supervision Stand pivot transfers: Min assist;Min guard       General transfer comment: unsteady on feet, buckling of knees, fatigues quickly  Ambulation/Gait Ambulation/Gait assistance: Min assist;Min guard Ambulation Distance (Feet): 40 Feet Assistive device: Rolling walker (2 wheeled) Gait Pattern/deviations: Decreased step length - right;Decreased step length - left;Decreased stride length;Shuffle Gait velocity: slow, somewhat unsteady   General Gait Details: limited mostly due to fatigue   Stairs             Wheelchair Mobility    Modified Rankin (Stroke Patients Only)       Balance Overall balance assessment: Needs assistance Sitting-balance support: Feet supported;No upper extremity supported Sitting balance-Leahy Scale: Good     Standing balance support: Bilateral upper extremity supported;During functional activity Standing balance-Leahy Scale: Fair Standing balance comment: fair/poor with RW                            Cognition Arousal/Alertness: Awake/alert Behavior During Therapy: WFL for tasks assessed/performed Overall Cognitive Status: Within Functional Limits for tasks assessed  Exercises Other Exercises Other Exercises: sit-to stand with RW x5 Other Exercises: stand balance with intermittent UE use - 30 seconds x2    General Comments        Pertinent Vitals/Pain Pain Assessment: 0-10 Pain Score: 8  Pain Location:  bilateral knees to bottoms of feet Pain Descriptors / Indicators: Aching;Discomfort Pain Intervention(s): Limited activity within patient's tolerance;Monitored during session    Home Living                      Prior Function            PT Goals (current goals can now be found in the care plan section) Progress towards PT goals: Progressing toward goals    Frequency    Min 4X/week      PT Plan Discharge plan needs to be updated    Co-evaluation              AM-PAC PT "6 Clicks" Daily Activity  Outcome Measure  Difficulty turning over in bed (including adjusting bedclothes, sheets and blankets)?: A Little Difficulty moving from lying on back to sitting on the side of the bed? : A Little Difficulty sitting down on and standing up from a chair with arms (e.g., wheelchair, bedside commode, etc,.)?: A Little Help needed moving to and from a bed to chair (including a wheelchair)?: A Little Help needed walking in hospital room?: A Little Help needed climbing 3-5 steps with a railing? : A Lot 6 Click Score: 17    End of Session Equipment Utilized During Treatment: Gait belt Activity Tolerance: Patient tolerated treatment well;Patient limited by fatigue Patient left: with call bell/phone within reach;in bed;with bed alarm set Nurse Communication: Mobility status PT Visit Diagnosis: Unsteadiness on feet (R26.81);Other abnormalities of gait and mobility (R26.89);Muscle weakness (generalized) (M62.81)     Time: 5784-6962 PT Time Calculation (min) (ACUTE ONLY): 35 min  Charges:  $Gait Training: 8-22 mins $Therapeutic Activity: 8-22 mins                    G Codes:       Weslee Fogg D. Hartnett-Rands, MS, PT [place] PT [facility] [phone] 06/01/2017, 1:03 PM

## 2017-06-01 NOTE — Clinical Social Work Note (Addendum)
LCSW following. Per MD, pt reportedly walked with nursing assistant to the bathroom using a walker and stand by assist. PT going to work with pt again today. Pt may be ready for dc. Pt does not have insurance and cannot go to SNF rehab.  Spoke with pt about the option of going to ETOH treatment at [facility] in [place]. Pt stated that he is "working on another plan". When asked what plan, pt stated he didn't know, he was still making calls.   Depending on PT assessment, pt may dc today. If he doesn't want ETOH treatment or need the homeless shelter, LCSW will sign off. Will be available if there are DC needs CSW can assist with.

## 2017-06-01 NOTE — Care Management Note (Signed)
Case Management Note  Patient Details  Name: Andre Rangel MRN: [id number] Date of Birth: [birth year]  If discussed at Long Length of Stay Meetings, dates discussed:  06/01/17 Additional Comments:  Malcolm Metro, RN 06/01/2017, 12:07 PM

## 2017-06-01 NOTE — Care Management Note (Signed)
Case Management Note  Patient Details  Name: Andre Rangel MRN: [id number] Date of Birth: 07-10-1960  Subjective/Objective:            Admitted with ETOH withdrawal. Pt kicked out of daughters home. Will have no residence at DC. Pt is uninsured, has no PCP, no transportation.         Action/Plan: PT recommends SNF. Pt has no payor source. Pt unable to ambulate on 5/20, was able to ambulate 15ft today. Pt will have option of inpt detox or homeless shelter once able to care for himself and ambulate further. Pt also making calls to find someone to stay with. CM discussed HH options and need for PCP. Pt says he would f/u with PCP at DC if CM made appointment. We discussed his best option being [facility] as he will need [facility] PT referral. CM will need address and phone number for [facility] and clinic to make contact with patient. CM will make referrals once pt has provided information. RW ordered. Olegario Messier, [facility] rep, aware and will deliver RW to pt room prior to DC. CM will cont to follow.   Expected Discharge Date:                  Expected Discharge Plan:     In-House Referral:  Clinical Social Work  Discharge planning Services  CM Consult  Post Acute Care Choice:  Durable Medical Equipment Choice offered to:  Patient  DME Arranged:  Walker rolling DME Agency:  [facility]  HH Arranged:    HH Agency:     Status of Service:  In process, will continue to follow  If discussed at Long Length of Stay Meetings, dates discussed:    Additional Comments:  Malcolm Metro, RN 06/01/2017, 1:38 PM

## 2017-06-02 LAB — GLUCOSE, CAPILLARY: Glucose-Capillary: 99 mg/dL (ref 65–99)

## 2017-06-02 NOTE — Clinical Social Work Note (Signed)
Patient is on the waitlist at [facility]. He has to call every morning to see if they have availability for him. LCSW provided the contact number for [facility]. LCSW also provided patient a list of homeless shelters and advised him to begin contacting them to identify availability. Patient displayed understanding of the need to contact [facility] every morning and begin contacting shelters evidenced by repeating information back to LCSW. Patient stated that he does not have any additional family or friends that could provide him with shelter. LCSW advised that CSW would assist with transport to shelter or [facility].   Andre Rangel, Juleen China, LCSW

## 2017-06-02 NOTE — Significant Event (Signed)
Patient was discharged yesterday however patient was homeless as his daughter refused to take him back.  Today patient's paperwork was faxed to alcohol treatment program in [place] and is pending acceptance.  Patient will be discharged today.  He was evaluated by PT and is noted to be ambulatory.  No charge

## 2017-06-02 NOTE — Clinical Social Work Note (Signed)
LCSW following. Aware that pt was unable to make arrangements for somewhere to go at dc yesterday. Met with pt again this AM. He is agreeable to going to [facility] in [facility] for their ETOH treatment program. Pt gave verbal permission for LCSW to fax his requested clinical documentation to [facility] today.   Fax sent and awaiting determination. Asked PT to work with pt again today as pt needs to be totally independent at dc. Discussed with RN CM possibility of getting pt a rollator at dc so that he has somewhere to sit if he becomes fatigued while ambulating. She will look into it. LCSW will continue to follow and assist with pt's dc needs.

## 2017-06-02 NOTE — Progress Notes (Signed)
Physical Therapy Treatment Patient Details Name: Andre Rangel MRN: [id number] DOB: September 29, 1960 Today's Date: 06/02/2017    History of Present Illness Andre Rangel is a 57 y.o. male with medical history significant for alcohol dependence, now presenting to the emergency department with abdominal tenderness, nausea, and nonbloody vomiting, requesting help in achieving abstinence from alcohol.  Patient reports many years of alcohol dependence, typically consuming 3 to 4 L of wine daily.  He reports recently drinking more than his usual amount for 1 to 2 weeks before deciding to quit today.  He drank 1 L of wine last night.  Reports generalized abdominal tenderness with nausea and nonbloody vomiting, but denies fevers, chills, chest pain, or shortness of breath.  He reports quitting alcohol briefly in the past, suffered some mild withdrawal symptoms then, but denies experiencing hallucinations or seizures with abstinence.  He reports being determined to quit drinking for good, but believes he will need help achieving this.  He denies use of illicit substances and denies SI, HI, or hallucinations.    PT Comments    Overall patient demonstrates improvement in functional mobility and gait requiring less assistance and increased endurance/tolerance for ambulation in hallway, less buckling of knees, much improvement for bed mobility and sit to stands.  Patient encouraged to get out of bed with nursing staff as much as possible also with understanding acknowledged.  Patient will benefit from continued physical therapy in hospital and recommended venue below to increase strength, balance, endurance for safe ADLs and gait.    Follow Up Recommendations  Home health PT;Supervision - Intermittent     Equipment Recommendations  Rolling walker with 5" wheels    Recommendations for Other Services       Precautions / Restrictions Precautions Precautions: Fall Restrictions Weight Bearing Restrictions: No     Mobility  Bed Mobility Overal bed mobility: Independent             General bed mobility comments: independent bed mobility  Transfers Overall transfer level: Needs assistance Equipment used: Rolling walker (2 wheeled) Transfers: Sit to/from Stand;Stand Pivot Transfers Sit to Stand: Supervision Stand pivot transfers: Supervision       General transfer comment: cueing for handplacement to assist with STS, unsteady on feet, fatgiues easily  Ambulation/Gait Ambulation/Gait assistance: Supervision Ambulation Distance (Feet): 30 Feet Assistive device: Rolling walker (2 wheeled) Gait Pattern/deviations: Decreased step length - right;Decreased step length - left;Decreased stride length Gait velocity: slow   General Gait Details: demonstrates slow slightly unsteady cadence without loss of balance, limited secondary to c/o fatigue   Stairs             Wheelchair Mobility    Modified Rankin (Stroke Patients Only)       Balance Overall balance assessment: Needs assistance Sitting-balance support: Feet supported;No upper extremity supported Sitting balance-Leahy Scale: Good     Standing balance support: During functional activity;Bilateral upper extremity supported Standing balance-Leahy Scale: Fair                              Cognition Arousal/Alertness: Awake/alert Behavior During Therapy: WFL for tasks assessed/performed Overall Cognitive Status: Within Functional Limits for tasks assessed                                        Exercises General Exercises - Lower Extremity Long Arc Quad: Seated;AROM;Strengthening;Both;10 reps  Hip Flexion/Marching: Seated;AROM;Strengthening;Both;10 reps Toe Raises: Seated;AROM;Strengthening;Both;10 reps Heel Raises: Seated;AROM;Strengthening;Both;10 reps    General Comments        Pertinent Vitals/Pain Pain Assessment: 0-10 Pain Score: 1  Pain Location: stomach Pain Descriptors /  Indicators: Discomfort Pain Intervention(s): Limited activity within patient's tolerance;Monitored during session    Home Living                      Prior Function            PT Goals (current goals can now be found in the care plan section) Acute Rehab PT Goals Patient Stated Goal: return home when able to walk Time For Goal Achievement: 06/20/17 Potential to Achieve Goals: Good Progress towards PT goals: Progressing toward goals    Frequency    Min 5X/week      PT Plan Current plan remains appropriate    Co-evaluation              AM-PAC PT "6 Clicks" Daily Activity  Outcome Measure  Difficulty turning over in bed (including adjusting bedclothes, sheets and blankets)?: None Difficulty moving from lying on back to sitting on the side of the bed? : None Difficulty sitting down on and standing up from a chair with arms (e.g., wheelchair, bedside commode, etc,.)?: A Little Help needed moving to and from a bed to chair (including a wheelchair)?: A Little Help needed walking in hospital room?: A Little Help needed climbing 3-5 steps with a railing? : A Lot 6 Click Score: 19    End of Session Equipment Utilized During Treatment: Gait belt Activity Tolerance: Patient limited by fatigue;Patient tolerated treatment well Patient left: in bed;with call bell/phone within reach;with bed alarm set Nurse Communication: Mobility status PT Visit Diagnosis: Unsteadiness on feet (R26.81);Other abnormalities of gait and mobility (R26.89);Muscle weakness (generalized) (M62.81)     Time: 0454-0981 PT Time Calculation (min) (ACUTE ONLY): 27 min  Charges:  $Therapeutic Exercise: 8-22 mins $Therapeutic Activity: 8-22 mins                    G Codes:       3:58 PM, 2017/07/01 Ocie Bob, MPT Physical Therapist with [facility] [phone] office [phone] mobile phone

## 2017-06-02 NOTE — Progress Notes (Signed)
Physical Therapy Treatment Patient Details Name: Andre Rangel MRN: [id number] DOB: 1960/07/31 Today's Date: 06/02/2017    History of Present Illness Andre Rangel is a 57 y.o. male with medical history significant for alcohol dependence, now presenting to the emergency department with abdominal tenderness, nausea, and nonbloody vomiting, requesting help in achieving abstinence from alcohol.  Patient reports many years of alcohol dependence, typically consuming 3 to 4 L of wine daily.  He reports recently drinking more than his usual amount for 1 to 2 weeks before deciding to quit today.  He drank 1 L of wine last night.  Reports generalized abdominal tenderness with nausea and nonbloody vomiting, but denies fevers, chills, chest pain, or shortness of breath.  He reports quitting alcohol briefly in the past, suffered some mild withdrawal symptoms then, but denies experiencing hallucinations or seizures with abstinence.  He reports being determined to quit drinking for good, but believes he will need help achieving this.  He denies use of illicit substances and denies SI, HI, or hallucinations.    PT Comments    Pt supine in bed and willing to participate.  Pt stated he has done some walking inside room with walker to restroom and back earlier today and reports some fatigue initially.  Pt independent with bed mobility following verbal cueing for hand placement to assist.  Min A with transfer, somewhat unsteady initially standing.  Increased distance with gait training use of RW.  Pt ambulated initially 52 ft then felt motivated to complete second set.  Visible fatigue with LE and decreased cadence second set due to fatigue, no LOB episodes during gait.  EOS pt left in chair with call bell within reach and chair alarm set.  No reports of pain during session.    Follow Up Recommendations  SNF;Supervision for mobility/OOB     Equipment Recommendations  Rolling walker with 5" wheels    Recommendations  for Other Services       Precautions / Restrictions Precautions Precautions: Fall Restrictions Weight Bearing Restrictions: No    Mobility  Bed Mobility Overal bed mobility: Independent             General bed mobility comments: independent bed mobility  Transfers Overall transfer level: Modified independent Equipment used: Rolling walker (2 wheeled) Transfers: Sit to/from Stand Sit to Stand: Min guard         General transfer comment: cueing for handplacement to assist with STS, unsteady on feet, fatgiues easily  Ambulation/Gait Ambulation/Gait assistance: Min guard Ambulation Distance (Feet): 104 Feet(2 sets of 15ft) Assistive device: Rolling walker (2 wheeled) Gait Pattern/deviations: Decreased step length - right;Decreased step length - left;Decreased stride length;Shuffle Gait velocity: slow, somewhat unsteady   General Gait Details: limited mostly due to fatigue   Stairs             Wheelchair Mobility    Modified Rankin (Stroke Patients Only)       Balance                                            Cognition Arousal/Alertness: Awake/alert Behavior During Therapy: WFL for tasks assessed/performed Overall Cognitive Status: Within Functional Limits for tasks assessed  Exercises      General Comments        Pertinent Vitals/Pain Pain Assessment: No/denies pain    Home Living                      Prior Function            PT Goals (current goals can now be found in the care plan section) Progress towards PT goals: Progressing toward goals    Frequency    Min 4X/week      PT Plan      Co-evaluation              AM-PAC PT "6 Clicks" Daily Activity  Outcome Measure  Difficulty turning over in bed (including adjusting bedclothes, sheets and blankets)?: A Little Difficulty moving from lying on back to sitting on the side of the bed?  : A Little Difficulty sitting down on and standing up from a chair with arms (e.g., wheelchair, bedside commode, etc,.)?: A Little Help needed moving to and from a bed to chair (including a wheelchair)?: A Little Help needed walking in hospital room?: A Little Help needed climbing 3-5 steps with a railing? : A Lot 6 Click Score: 17    End of Session Equipment Utilized During Treatment: Gait belt Activity Tolerance: Patient tolerated treatment well;Patient limited by fatigue Patient left: in chair;with chair alarm set;with call bell/phone within reach Nurse Communication: Mobility status PT Visit Diagnosis: Unsteadiness on feet (R26.81);Other abnormalities of gait and mobility (R26.89);Muscle weakness (generalized) (M62.81)     Time: 9562-1308 PT Time Calculation (min) (ACUTE ONLY): 17 min  Charges:  $Therapeutic Activity: 8-22 mins                    G Codes:       Becky Sax, LPTA; CBIS [phone] Juel Burrow 06/02/2017, 1:14 PM

## 2017-06-02 NOTE — Care Management (Signed)
Pt now plans to DC to ETOH program. Pt's RW has been delivered. CSW working on placement into program. Pt will DC once disposition secured.

## 2017-06-03 LAB — BASIC METABOLIC PANEL WITH GFR
BUN: 13 mg/dL (ref 6–20)
Chloride: 101 mmol/L (ref 101–111)
GFR calc Af Amer: >60 mL/min (ref >60)
Sodium: 135 mmol/L (ref 135–145)

## 2017-06-03 LAB — BASIC METABOLIC PANEL
Anion gap: 7 (ref 5–15)
CO2: 27 mmol/L (ref 22–32)
Calcium: 8.8 mg/dL — ABNORMAL LOW (ref 8.9–10.3)
Creatinine, Ser: 0.73 mg/dL (ref 0.61–1.24)
GFR calc non Af Amer: >60 mL/min (ref >60)
Glucose, Bld: 111 mg/dL — ABNORMAL HIGH (ref 65–99)
Potassium: 3.8 mmol/L (ref 3.5–5.1)

## 2017-06-03 LAB — CBC
HCT: 33.6 % — ABNORMAL LOW (ref 39.0–52.0)
Hemoglobin: 11.1 g/dL — ABNORMAL LOW (ref 13.0–17.0)
MCH: 31.5 pg (ref 26.0–34.0)
MCHC: 33 g/dL (ref 30.0–36.0)
MCV: 95.5 fL (ref 78.0–100.0)
Platelets: 277 10*3/uL (ref 150–400)
RBC: 3.52 MIL/uL — ABNORMAL LOW (ref 4.22–5.81)
RDW: 15.1 % (ref 11.5–15.5)
WBC: 9.5 10*3/uL (ref 4.0–10.5)

## 2017-06-03 LAB — PHOSPHORUS: Phosphorus: 3.5 mg/dL (ref 2.5–4.6)

## 2017-06-03 LAB — MAGNESIUM: Magnesium: 1.9 mg/dL (ref 1.7–2.4)

## 2017-06-03 LAB — GLUCOSE, CAPILLARY: Glucose-Capillary: 87 mg/dL (ref 65–99)

## 2017-06-03 NOTE — Progress Notes (Addendum)
Nutrition Follow-up  DOCUMENTATION CODES:  Non-severe (moderate) malnutrition in context of chronic illness  INTERVENTION:  Continue goal of meeting equal to or greater than 90% of pt needs.  Monitor PO intake and reassess as needed.   NUTRITION DIAGNOSIS:  Moderate Malnutrition related to chronic illness(alcoholism (drinks 1-4 liters wine daiy)) as evidenced by mild muscle depletion, moderate muscle depletion, mild fat depletion.  Ongoing  GOAL:  Patient will meet greater than or equal to 90% of their needs  Goal being met currently.  MONITOR:  PO intake, Supplement acceptance, Labs, Weight trends  ASSESSMENT:   57 y/o male, follow up after concern for moderate malnutrition due to alcohol abuse.  Pt indicated he's been eating and enjoying 100% of meals and consuming his ensure enlive.     Pt mentioned he feels mild nausea before eating, but not when he's eating or after he's eating, and does not feel the urge to vomit.  He has no food requests/preferences. Is liking the Ensure. No c/d  Seems to be progressing well, no further intervention required at this time. Anticipate quick discharge once patient able to find placement.    Diet Order:   Diet Order           Diet - low sodium heart healthy        Diet regular Room service appropriate? Yes; Fluid consistency: Thin  Diet effective now          EDUCATION NEEDS:  Not appropriate for education at this time  Skin:  Skin Assessment: Reviewed RN Assessment(abrasion to elbow and ankle)  Last BM:  5/17   Height:  Ht Readings from Last 1 Encounters:  05/28/17 5' 10" (1.778 m)   Weight:  Wt Readings from Last 1 Encounters:  05/28/17 162 lb 14.7 oz (73.9 kg)    Ideal Body Weight:  75 kg  BMI:  Body mass index is 23.38 kg/m.  Estimated Nutritional Needs:  Kcal:  2146-2294  (29-31 kcal/kg) Protein:  81-88 gr (1.1-1.2 gr/kg) Fluid:  2.1-2.3 liters daily 

## 2017-06-03 NOTE — Progress Notes (Signed)
Physical Therapy Treatment Patient Details Name: Andre Rangel MRN: [id number] DOB: [birth year] Today's Date: 06/03/2017    History of Present Illness Andre Rangel is a 57 y.o. male with medical history significant for alcohol dependence, now presenting to the emergency department with abdominal tenderness, nausea, and nonbloody vomiting, requesting help in achieving abstinence from alcohol.  Patient reports many years of alcohol dependence, typically consuming 3 to 4 L of wine daily.  He reports recently drinking more than his usual amount for 1 to 2 weeks before deciding to quit today.  He drank 1 L of wine last night.  Reports generalized abdominal tenderness with nausea and nonbloody vomiting, but denies fevers, chills, chest pain, or shortness of breath.  He reports quitting alcohol briefly in the past, suffered some mild withdrawal symptoms then, but denies experiencing hallucinations or seizures with abstinence.  He reports being determined to quit drinking for good, but believes he will need help achieving this.  He denies use of illicit substances and denies SI, HI, or hallucinations.    PT Comments    Patient demonstrates improvement for sit to stands requiring less VC, ambulated in hallway without loss of balance, limited due to c/o fatigue and requested to go back to bed.  Patient will benefit from continued physical therapy in hospital and recommended venue below to increase strength, balance, endurance for safe ADLs and gait.    Follow Up Recommendations  Home health PT;Supervision - Intermittent     Equipment Recommendations  Rolling walker with 5" wheels    Recommendations for Other Services       Precautions / Restrictions Precautions Precautions: Fall Restrictions Weight Bearing Restrictions: No    Mobility  Bed Mobility Overal bed mobility: Independent                Transfers Overall transfer level: Needs assistance   Transfers: Sit to/from Stand;Stand  Pivot Transfers Sit to Stand: Supervision Stand pivot transfers: Supervision       General transfer comment: demonstrates improvement for sit to stands without VC's  Ambulation/Gait Ambulation/Gait assistance: Supervision Ambulation Distance (Feet): 60 Feet Assistive device: Rolling walker (2 wheeled) Gait Pattern/deviations: Decreased step length - right;Decreased step length - left;Decreased stride length Gait velocity: slow   General Gait Details: demonstrates slow slightly unsteady cadence without loss of balance, limited secondary to c/o fatigue   Stairs             Wheelchair Mobility    Modified Rankin (Stroke Patients Only)       Balance Overall balance assessment: Needs assistance Sitting-balance support: Feet supported;No upper extremity supported Sitting balance-Leahy Scale: Good     Standing balance support: Bilateral upper extremity supported;During functional activity Standing balance-Leahy Scale: Fair Standing balance comment: using RW                            Cognition Arousal/Alertness: Awake/alert Behavior During Therapy: WFL for tasks assessed/performed Overall Cognitive Status: Within Functional Limits for tasks assessed                                        Exercises General Exercises - Lower Extremity Long Arc Quad: Seated;AROM;Strengthening;Both;10 reps Hip Flexion/Marching: Seated;AROM;Strengthening;Both;10 reps Toe Raises: Seated;AROM;Strengthening;Both;10 reps Heel Raises: Seated;AROM;Strengthening;Both;10 reps    General Comments        Pertinent Vitals/Pain Pain Assessment: 0-10 Pain Score: 1  Pain Location: stomach Pain Descriptors / Indicators: Discomfort Pain Intervention(s): Limited activity within patient's tolerance;Monitored during session    Home Living                      Prior Function            PT Goals (current goals can now be found in the care plan section)  Acute Rehab PT Goals Patient Stated Goal: return home when able to walk PT Goal Formulation: With patient Time For Goal Achievement: 06/20/17 Potential to Achieve Goals: Good Progress towards PT goals: Progressing toward goals    Frequency    Min 5X/week      PT Plan Current plan remains appropriate    Co-evaluation              AM-PAC PT "6 Clicks" Daily Activity  Outcome Measure  Difficulty turning over in bed (including adjusting bedclothes, sheets and blankets)?: None Difficulty moving from lying on back to sitting on the side of the bed? : None Difficulty sitting down on and standing up from a chair with arms (e.g., wheelchair, bedside commode, etc,.)?: None Help needed moving to and from a bed to chair (including a wheelchair)?: A Little Help needed walking in hospital room?: A Little Help needed climbing 3-5 steps with a railing? : A Lot 6 Click Score: 20    End of Session   Activity Tolerance: Patient tolerated treatment well;Patient limited by fatigue Patient left: in bed;with call bell/phone within reach;with bed alarm set Nurse Communication: Mobility status PT Visit Diagnosis: Unsteadiness on feet (R26.81);Other abnormalities of gait and mobility (R26.89);Muscle weakness (generalized) (M62.81)     Time: 1010-1033 PT Time Calculation (min) (ACUTE ONLY): 23 min  Charges:  $Therapeutic Activity: 23-37 mins                    G Codes:       2:57 PM, 06-17-17 Ocie Bob, MPT Physical Therapist with [facility] [phone] office [phone] mobile phone

## 2017-06-03 NOTE — Clinical Social Work Note (Signed)
LCSW following. Working to try and get pt into ETOH treatment at [facility] in [place]. Shayla at [facility] would like to do an intake interview with pt over the phone at 1400 today. Per Drema Pry, once that process is completed, information is presented to their intake nurses who make the final decision about admission. If pt is accepted, Drema Pry states that they should be able to admit him tomorrow.   If pt leaves before then, he will have to go to a homeless shelter. [facility] in [place] at [address] Sherian Maroon has availability. Annia Friendly at the shelter needs to be notified if pt is coming and he needs to be there before 5pm. Her number is [phone]. Pt would need assistance with transportation and also would need his dc medications filled. The shelter only has top bunks available and pt would need to be able to get up on the top bunk. Discussed with PT Fayrene Fearing who indicated he thinks pt could do that.  If pt is not accepted for [facility], he will need to go to a homeless shelter of some sort until he can secure a place to stay on his own.  Covering LCSW, Tretha Sciara, to continue to follow this afternoon to further assist with pt's dc planning needs.

## 2017-06-03 NOTE — Clinical Social Work Note (Signed)
Patient completed intake with Shayla at [facility]. Drema Pry stated that the nursing director would review patient's paperwork and make a decision today before 5, for possible intake in the morning, this will prevent patient from going to the homeless shelter before 5 today.  Disposition: Awaiting return call from [facility] to identify if patient is accepted. If patient is accepted, he will go 5/24. If he is not accepted he will go to the homeless shelter on 5/24.

## 2017-06-03 NOTE — Progress Notes (Signed)
Patient did not leave the hospital as he was still on the wait list for inpatient rehabilitation at [facility].  Discussed with social work and the patient and that he could safely be discharged to homeless shelter and call until he had an opening at a [facility].

## 2017-06-03 NOTE — Care Management (Signed)
CM completed MATCH voucher for pt's Librium Rx, placed on pt's chart with Rx to be given with DC paperwork.

## 2017-06-04 LAB — GLUCOSE, CAPILLARY: GLUCOSE-CAPILLARY: 83 mg/dL (ref 65–99)

## 2017-06-04 NOTE — Progress Notes (Signed)
Discharge instructions reviewed with patient. Reviewed discharge instructions given by CSW and arrangements made for patient transportation and homeless shelter. Patient verbalized understanding of instructions.

## 2017-06-04 NOTE — Clinical Social Work Note (Signed)
LCSW following. Spoke with Shayla at [facility] and they do not have a bed for pt today. Pt remains on the waiting list. Have arranged for pt to go to the [facility] in [place] today where he can stay until a bed opens at [facility]. Pt given all the detailed information about the shelter and [facility] both verbally and in written format. He is aware that staff at the shelter can help him coordinate with [facility].   Arranged cab for pt for 1300. RN updated. No other CSW needs for dc.
